# Patient Record
Sex: Male | Born: 1960
Health system: Southern US, Community
[De-identification: ages and names within clinical notes are randomized; demographics above are authoritative.]

## PROBLEM LIST (undated history)

## (undated) DIAGNOSIS — G43909 Migraine, unspecified, not intractable, without status migrainosus: Secondary | ICD-10-CM

## (undated) HISTORY — DX: Migraine, unspecified, not intractable, without status migrainosus: G43.909

---

## 2000-01-10 ENCOUNTER — Emergency Department (HOSPITAL_COMMUNITY): Admission: EM | Admit: 2000-01-10 | Discharge: 2000-01-10 | Payer: Self-pay | Admitting: Ophthalmology

## 2005-07-07 ENCOUNTER — Ambulatory Visit (HOSPITAL_COMMUNITY): Admission: RE | Admit: 2005-07-07 | Discharge: 2005-07-07 | Payer: Self-pay | Admitting: Allergy

## 2005-07-07 IMAGING — CR DG CHEST 2V
2 series · 2 of 2 positions shown · non-contrast
Comparison: None.

CLINICAL DATA: Cough and asthma. 
 CHEST - 2 VIEW ? [DATE]:

[view not recorded (1 of 2)]
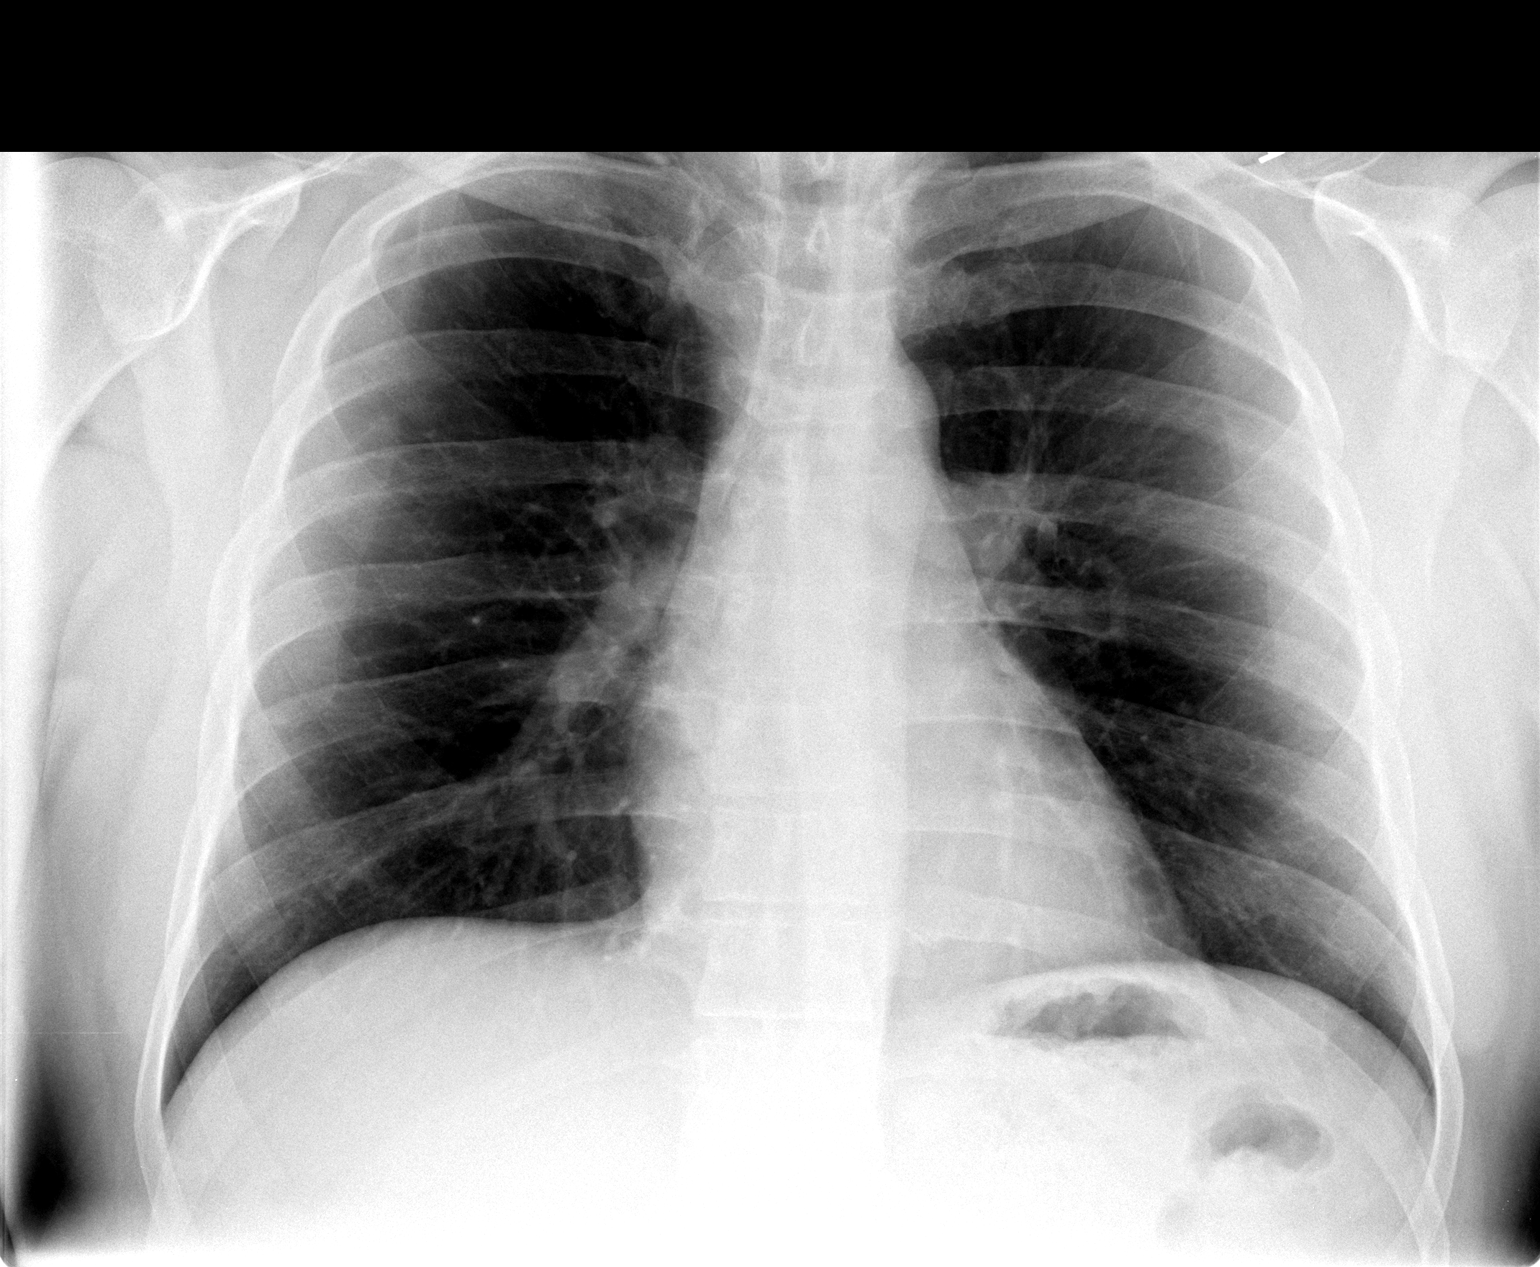

[view not recorded (2 of 2)]
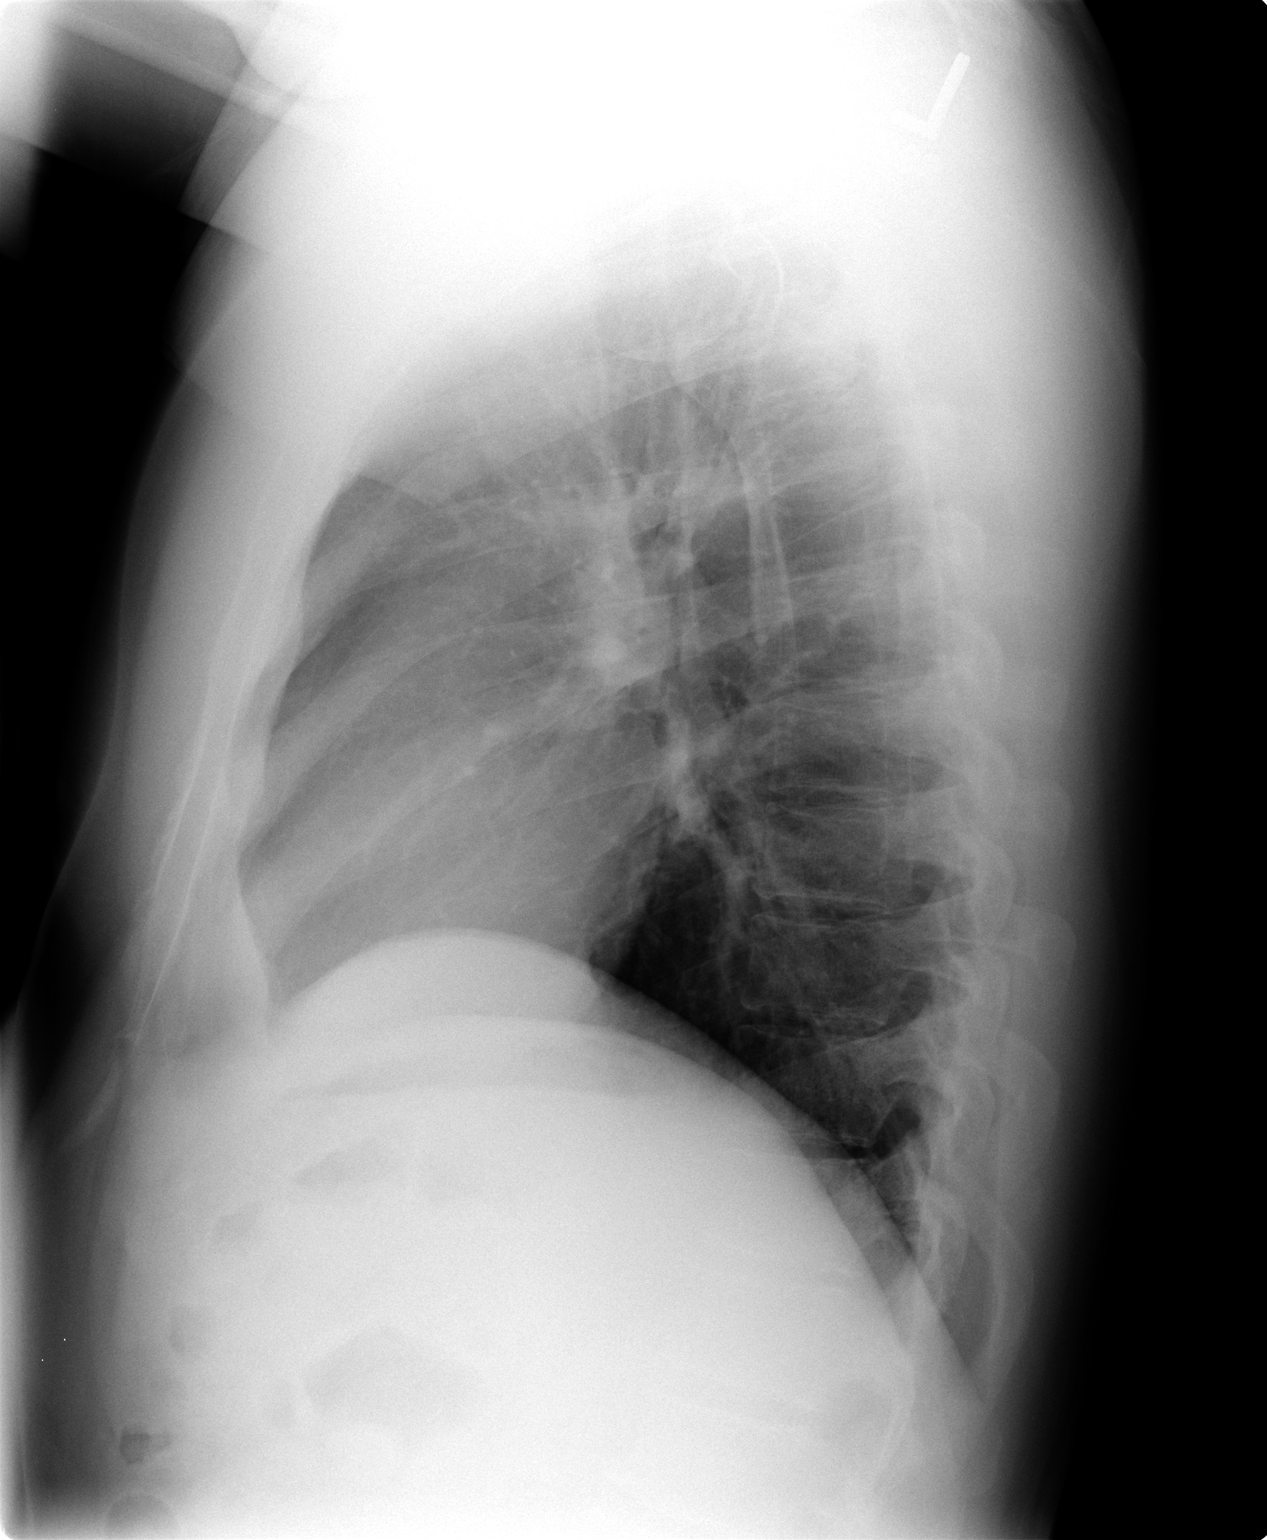

[2 of 2 positions shown; findings below may reference images not displayed]

FINDINGS: Heart size is normal.   There are no effusions or edema. No focal airspace opacities are noted.
IMPRESSION: No active cardiopulmonary disease.

## 2008-01-02 ENCOUNTER — Encounter: Admission: RE | Admit: 2008-01-02 | Discharge: 2008-01-02 | Payer: Self-pay | Admitting: Family Medicine

## 2008-01-02 IMAGING — CT CT ABDOMEN W/ CM
2 of 5 series · 17 of 46 positions shown, 19 images · IV contrast (READICAT/WATER & [ID] [id number])
Comparison: None

CT ABDOMEN

CLINICAL DATA: Right lower quadrant abdominal pain

CT ABDOMEN AND PELVIS WITH CONTRAST
TECHNIQUE: Multidetector CT imaging of the abdomen and pelvis was
performed using the standard protocol following bolus
administration of intravenous contrast.
Contrast: 125 ml [2E]

[Series 2: abdomen w/ · axial · 0.84mm/px · z∈[-400,+25]mm · 14 of 93 slices shown, 16 images]
[im 5/93  soft-tissue]
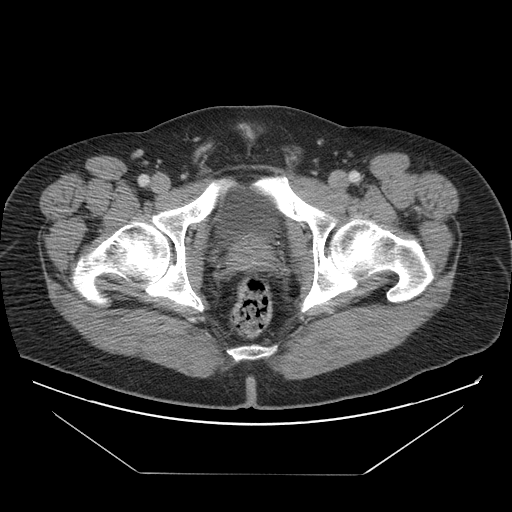
[im 5/93  bone]
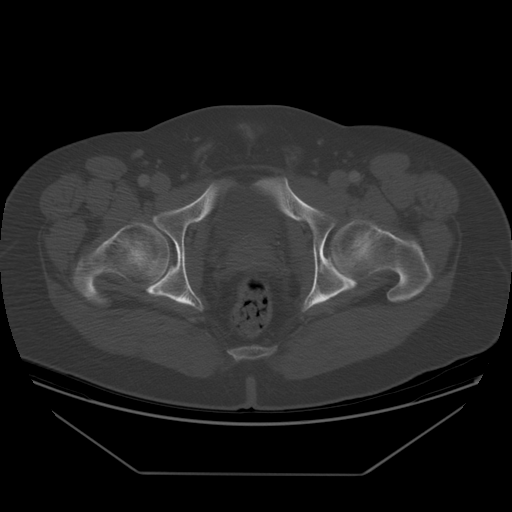
[im 14/93  soft-tissue]
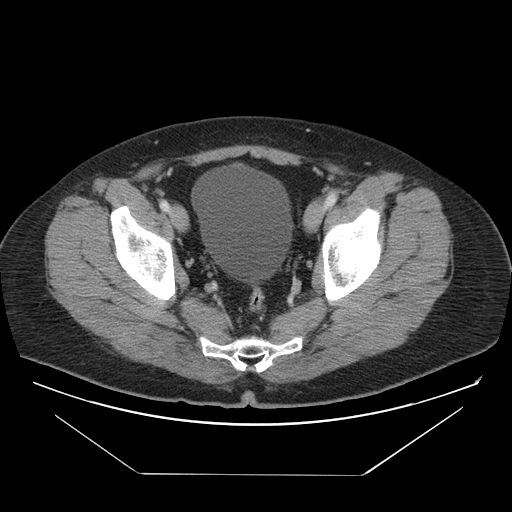
[im 19/93  soft-tissue]
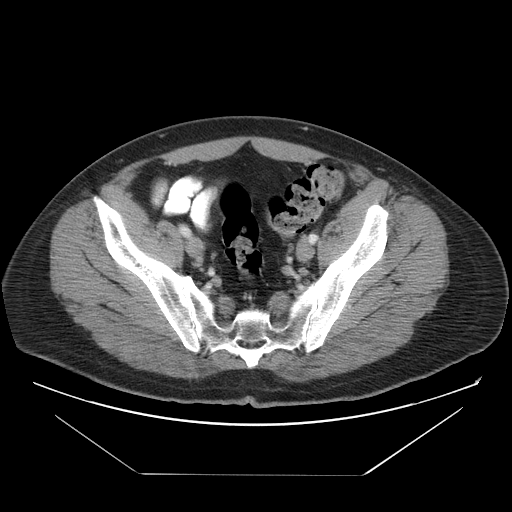
[im 24/93  soft-tissue]
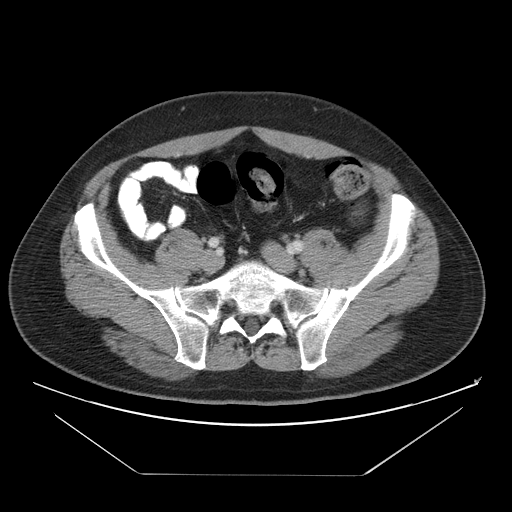
[im 33/93  soft-tissue]
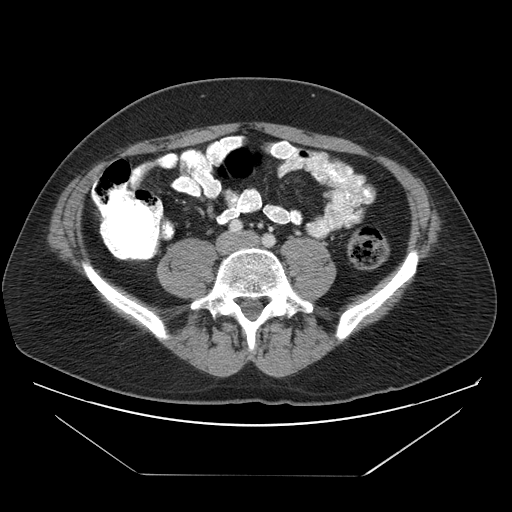
[im 37/93  soft-tissue]
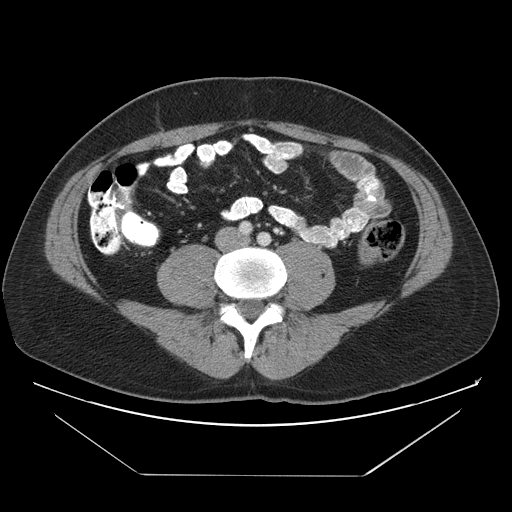
[im 42/93  soft-tissue]
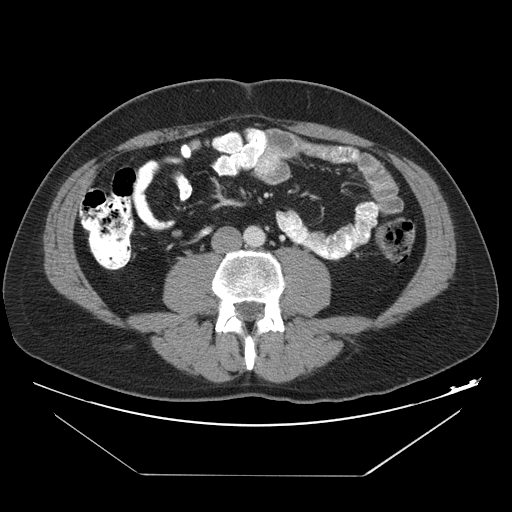
[im 51/93  soft-tissue]
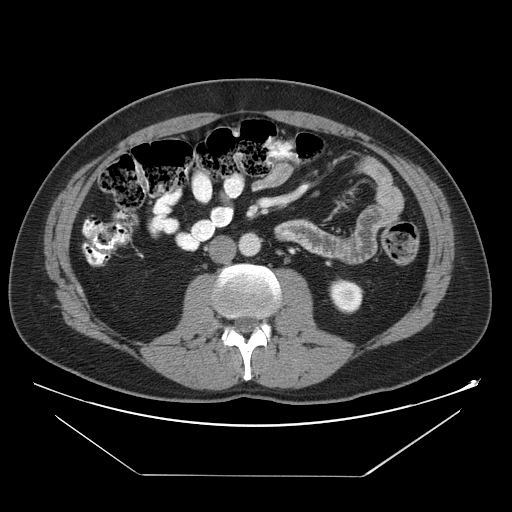
[im 56/93  soft-tissue]
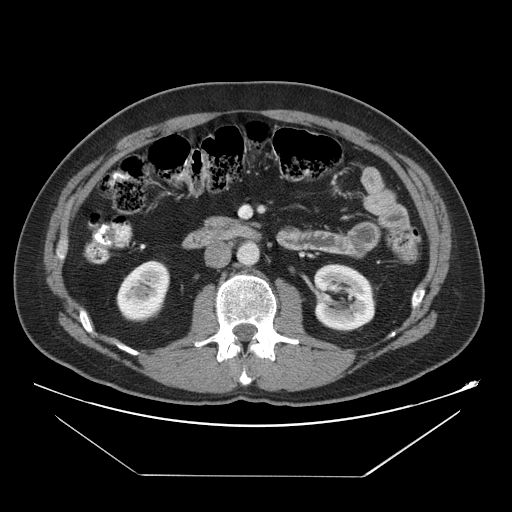
[im 56/93  bone]
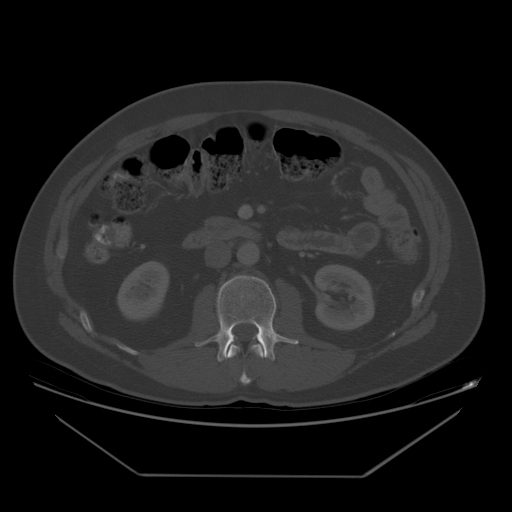
[im 60/93  soft-tissue]
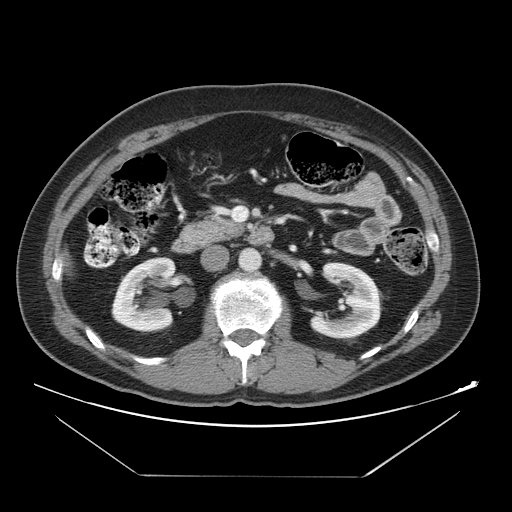
[im 70/93  soft-tissue]
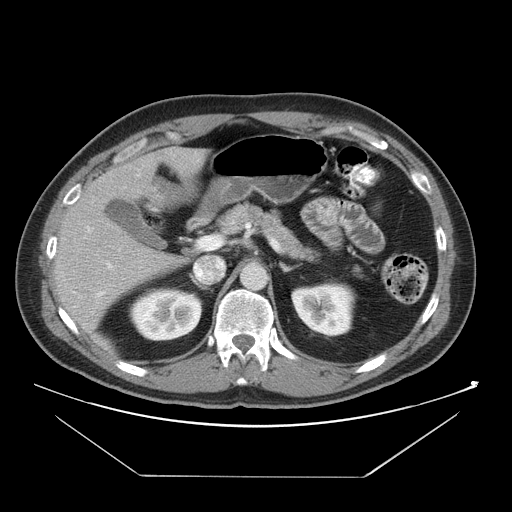
[im 74/93  soft-tissue]
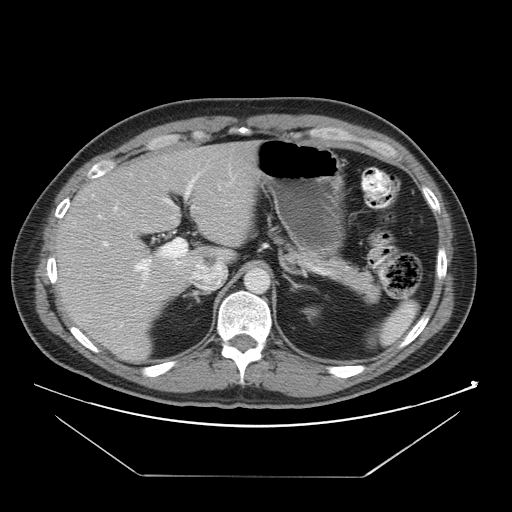
[im 79/93  soft-tissue]
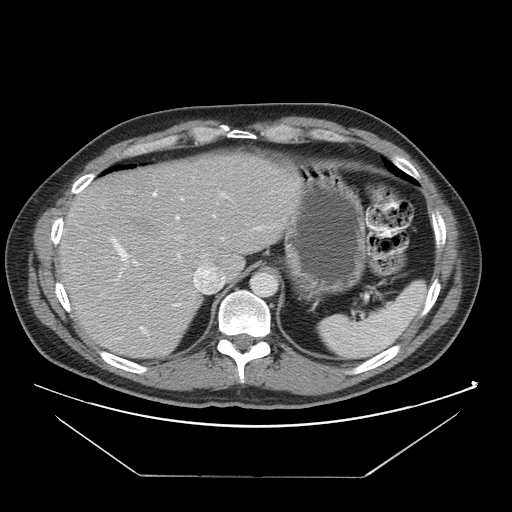
[im 88/93  soft-tissue]
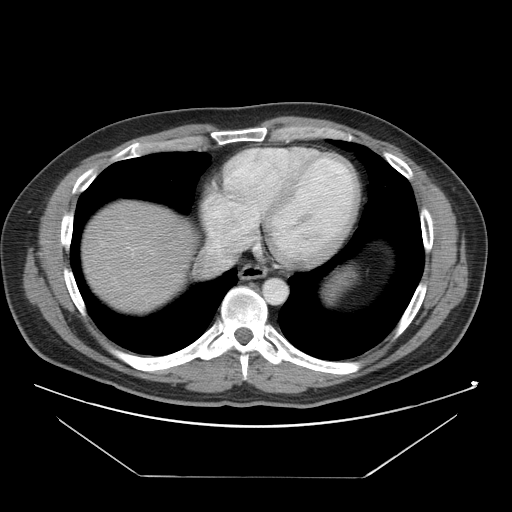

[Series 401: coronal · coronal · 1.04mm/px · 3 of 122 slices shown]
[im 41/122  soft-tissue]
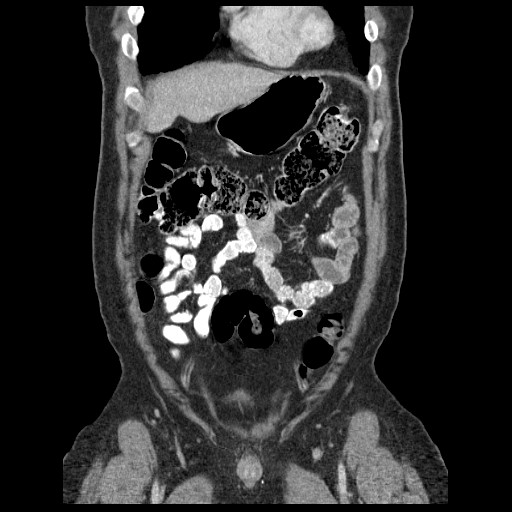
[im 54/122  soft-tissue]
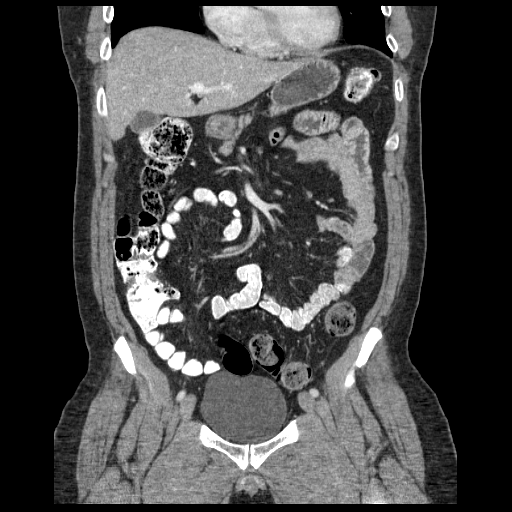
[im 68/122  soft-tissue]
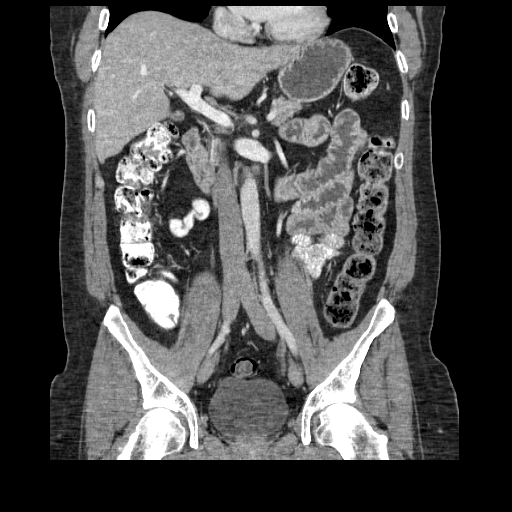

[17 of 46 positions shown; findings below may reference images not displayed]

FINDINGS: The lung bases are clear.  The liver is slightly low in
attenuation and mild fatty infiltration is a consideration.  No
calcified gallstones are seen.  The pancreas is normal in size and
the pancreatic duct is not dilated.  The adrenal glands and spleen
appear normal.  The kidneys enhance and on delayed images there are
small parapelvic cysts bilaterally, with the pelvocaliceal systems
appearing normal.  The abdominal aorta is normal in caliber.  No
adenopathy is seen.
IMPRESSION: No significant abnormality on CT of the abdomen.  Possible mild
fatty infiltration of the liver.

CT PELVIS
FINDINGS: The appendix and the terminal ileum are relatively well
seen and appear normal.  There is no evidence by CT of acute
appendicitis.  Moderate amount of feces is noted throughout the
colon.  The urinary bladder is unremarkable.  No pelvic mass or
fluid is seen.  No bony abnormality is noted.
IMPRESSION: No acute abnormality on CT the pelvis.  The appendix and terminal
ileum appear normal.

## 2008-05-03 DIAGNOSIS — J398 Other specified diseases of upper respiratory tract: Secondary | ICD-10-CM | POA: Insufficient documentation

## 2009-07-29 ENCOUNTER — Ambulatory Visit: Payer: Self-pay | Admitting: Family Medicine

## 2009-07-29 DIAGNOSIS — G43909 Migraine, unspecified, not intractable, without status migrainosus: Secondary | ICD-10-CM | POA: Insufficient documentation

## 2009-07-29 DIAGNOSIS — R1011 Right upper quadrant pain: Secondary | ICD-10-CM

## 2009-07-29 DIAGNOSIS — G4700 Insomnia, unspecified: Secondary | ICD-10-CM

## 2009-09-13 ENCOUNTER — Ambulatory Visit: Payer: Self-pay | Admitting: Family Medicine

## 2009-09-17 LAB — CONVERTED CEMR LAB
ALT: 40 units/L (ref 0–53)
AST: 38 units/L — ABNORMAL HIGH (ref 0–37)
Alkaline Phosphatase: 40 units/L (ref 39–117)
Bilirubin, Direct: 0.1 mg/dL (ref 0.0–0.3)
Cholesterol: 181 mg/dL (ref 0–200)
GFR calc non Af Amer: 72.36 mL/min (ref >60)
Glucose, Bld: 95 mg/dL (ref 70–99)
HDL: 62.4 mg/dL (ref >39.00)
Potassium: 4.6 meq/L (ref 3.5–5.1)
Total Bilirubin: 0.7 mg/dL (ref 0.3–1.2)
Total CHOL/HDL Ratio: 3
Triglycerides: 58 mg/dL (ref 0.0–149.0)

## 2010-02-06 DIAGNOSIS — J0100 Acute maxillary sinusitis, unspecified: Secondary | ICD-10-CM | POA: Insufficient documentation

## 2010-02-26 ENCOUNTER — Telehealth: Payer: Self-pay | Admitting: Family Medicine

## 2010-04-07 ENCOUNTER — Telehealth: Payer: Self-pay | Admitting: Family Medicine

## 2010-05-12 ENCOUNTER — Telehealth: Payer: Self-pay | Admitting: Family Medicine

## 2010-05-22 NOTE — Progress Notes (Signed)
Summary: alprazolam   Phone Note Refill Request Message from:  Fax from Pharmacy on April 07, 2010 9:23 AM  Refills Requested: Medication #1:  ALPRAZOLAM 0.25 MG TABS Take 1 or 2 tablets by mouth once daily as directed.   Last Refilled: 02/26/2010 [facility]. [phone].   Initial call taken by: Melody Comas,  April 07, 2010 9:23 AM  Follow-up for Phone Call        denied, filled 02/26/2010 Hannah Beat MD  April 07, 2010 9:48 AM   Additional Follow-up for Phone Call Additional follow up Details #1::        my error. please refill for my partner, filling for Dr. Dayton Martes, on vacation. Hannah Beat MD  April 07, 2010 10:27 AM  Rx called to [facility].  Additional Follow-up by: Linde Gillis CMA Duncan Dull),  April 07, 2010 11:03 AM    Prescriptions: ALPRAZOLAM 0.25 MG TABS (ALPRAZOLAM) Take 1 or 2 tablets by mouth once daily as directed.  #60 x 0   Entered and Authorized by:   Hannah Beat MD   Signed by:   Hannah Beat MD on 04/07/2010   Method used:   [facility]ph[facility] to ...       [facility]       [address]       [place], [place]  [address]       Ph: [phone]       Fax: [phone]   RxID:   [id number]

## 2010-05-22 NOTE — Assessment & Plan Note (Signed)
Summary: CPX/DLO   Vital Signs:  Patient profile:   50 year old male Height:      74 inches Weight:      244.25 pounds BMI:     31.47 Temp:     97.8 degrees F oral Pulse rate:   76 / minute Pulse rhythm:   regular BP sitting:   120 / 82  (right arm) Cuff size:   large  Vitals Entered By: Linde Gillis CMA Duncan Dull) (Sep 13, 2009 8:27 AM) CC: physical exam   History of Present Illness: 50 yo here for CPX.  Has been having several months of RUQ pain and nausea that is being worked up by GI. Colonoscopy was neg on 2/10/1. Has appt with [facility] on 08/07/09 for RUQ ultrasound, per pt ultrasound was normal. No blood in stool or changes in bowel habits. Not vomiting.  No yellowing of skin. No fevers, chills or weight loss. Since he stopped doing oblique crunches, has had no recurrance of the pain.    Migraines- gets 2-3 per month.  Associated with photophobia and nausea. Always alleviated by imitrex.  Change in seasons tends to be a trigger.  Tramadol and Immetrex both releive his migraines.    Takes propranolol for pubic speaking as needed, bp is normal.  Insomnia- was taking Ambien, made him feel hung over the next day.  Now taking Alprazolam 0.5 mg as needed at bedtime insomnia.  Well man- due for fasting labs. Very healthy male.  Exercises several days a week.  Current Medications (verified): 1)  Finasteride 5 Mg Tabs (Finasteride) .... Take 1/2 Tablet By Mouth Once Daily 2)  Propranolol Hcl 20 Mg Tabs (Propranolol Hcl) .... As Directed As Needed 3)  Alprazolam 0.25 Mg Tabs (Alprazolam) .... Take 1 or 2 Tablets By Mouth Once Daily As Directed. 4)  Imitrex 100 Mg Tabs (Sumatriptan Succinate) .... Take One Tablet By Mouth Once Daily 5)  Omeprazole 20 Mg Cpdr (Omeprazole) .... Take 1 Tablet By Mouth Once A Day 6)  Tramadol Hcl 50 Mg  Tabs (Tramadol Hcl) .Marland Kitchen.. 1 Tablet Twice Daily As Needed  Allergies (verified): No Known Drug Allergies  Past History:  Past Medical  History: Last updated: 11-Aug-2009 Migraines Normal colonoscopy in 05/2009  Family History: Last updated: 11-Aug-2009 Dad- deceased, MI in 48s  Social History: Last updated: Aug 11, 2009 Lives with wife and 2 daughters, 48 and 17. Never Smoked Alcohol use-no Drug use-no  Risk Factors: Smoking Status: never (Aug 11, 2009)  Family History: Reviewed history from 08-11-09 and no changes required. Dad- deceased, MI in 48s  Social History: Reviewed history from 08/11/09 and no changes required. Lives with wife and 2 daughters, 48 and 15. Never Smoked Alcohol use-no Drug use-no  Review of Systems      See HPI General:  Denies loss of appetite and malaise. Eyes:  Denies blurring. ENT:  Denies difficulty swallowing. CV:  Denies chest pain or discomfort. Resp:  Denies shortness of breath. GI:  Denies abdominal pain, bloody stools, and change in bowel habits. GU:  Denies erectile dysfunction, urinary frequency, and urinary hesitancy. MS:  Denies joint pain, joint redness, joint swelling, and loss of strength. Derm:  Denies rash. Neuro:  Denies headaches. Psych:  Denies anxiety and depression. Endo:  Denies cold intolerance and heat intolerance. Heme:  Denies abnormal bruising and bleeding.  Physical Exam  General:  Well-developed,well-nourished,in no acute distress; alert,appropriate and cooperative throughout examination Head:  normocephalic, atraumatic, and no abnormalities observed.   Eyes:  vision grossly  intact, pupils equal, pupils round, and pupils reactive to light.   Ears:  R ear normal and L ear normal.   Nose:  no external deformity.   Mouth:  good dentition.   Lungs:  Normal respiratory effort, chest expands symmetrically. Lungs are clear to auscultation, no crackles or wheezes. Heart:  Normal rate and regular rhythm. S1 and S2 normal without gallop, murmur, click, rub or other extra sounds. Abdomen:  Bowel sounds positive,abdomen soft and non-tender without  masses, organomegaly or hernias noted. Msk:  No deformity or scoliosis noted of thoracic or lumbar spine.   Extremities:  No clubbing, cyanosis, edema, or deformity noted with normal full range of motion of all joints.   Neurologic:  No cranial nerve deficits noted. Station and gait are normal. Plantar reflexes are down-going bilaterally. DTRs are symmetrical throughout. Sensory, motor and coordinative functions appear intact. Skin:  Intact without suspicious lesions or rashes Psych:  Cognition and judgment appear intact. Alert and cooperative with normal attention span and concentration. No apparent delusions, illusions, hallucinations   Impression & Recommendations:  Problem # 1:  PHYSICAL EXAMINATION (ICD-V70.0) Reviewed preventive care protocols, scheduled due services, and updated immunizations Discussed nutrition, exercise, diet, and healthy lifestyle.  FLP, BMET, hepatic panel, PSA today.  Orders: Venipuncture ([address]) TLB-BMP (Basic Metabolic Panel-BMET) (80048-METABOL)  Complete Medication List: 1)  Finasteride 5 Mg Tabs (Finasteride) .... Take 1/2 tablet by mouth once daily 2)  Propranolol Hcl 20 Mg Tabs (Propranolol hcl) .... As directed as needed 3)  Alprazolam 0.25 Mg Tabs (Alprazolam) .... Take 1 or 2 tablets by mouth once daily as directed. 4)  Imitrex 100 Mg Tabs (Sumatriptan succinate) .... Take one tablet by mouth once daily 5)  Omeprazole 20 Mg Cpdr (Omeprazole) .... Take 1 tablet by mouth once a day 6)  Tramadol Hcl 50 Mg Tabs (Tramadol hcl) .Marland Kitchen.. 1 tablet twice daily as needed  Other Orders: TLB-Hepatic/Liver Function Pnl (80076-HEPATIC) TLB-Lipid Panel (80061-LIPID) TLB-PSA (Prostate Specific Antigen) (84153-PSA) Prescriptions: TRAMADOL HCL 50 MG  TABS (TRAMADOL HCL) 1 tablet twice daily as needed  #60 x 6   Entered and Authorized by:   Ruthe Mannan MD   Signed by:   Ruthe Mannan MD on 09/13/2009   Method used:   Print then Give to Patient   RxID:    [id number] IMITREX 100 MG TABS (SUMATRIPTAN SUCCINATE) take one tablet by mouth once daily  #10 x 5   Entered and Authorized by:   Ruthe Mannan MD   Signed by:   Ruthe Mannan MD on 09/13/2009   Method used:   Print then Give to Patient   RxID:   [id number] ALPRAZOLAM 0.25 MG TABS (ALPRAZOLAM) Take 1 or 2 tablets by mouth once daily as directed.  #60 x 3   Entered and Authorized by:   Ruthe Mannan MD   Signed by:   Ruthe Mannan MD on 09/13/2009   Method used:   Print then Give to Patient   RxID:   [id number]   Current Allergies (reviewed today): No known allergies

## 2010-05-22 NOTE — Progress Notes (Signed)
Summary: alprazolam   Phone Note Refill Request Message from:  Fax from Pharmacy on May 12, 2010 9:39 AM  Refills Requested: Medication #1:  ALPRAZOLAM 0.25 MG TABS Take 1 or 2 tablets by mouth once daily as directed.   Last Refilled: 04/07/2010 [facility]. [phone]  Initial call taken by: Melody Comas,  May 12, 2010 9:39 AM  Follow-up for Phone Call        [facility]. Follow-up by: Linde Gillis CMA Duncan Dull),  May 12, 2010 9:48 AM    Prescriptions: ALPRAZOLAM 0.25 MG TABS (ALPRAZOLAM) Take 1 or 2 tablets by mouth once daily as directed.  #60 x 0   Entered and Authorized by:   Ruthe Mannan MD   Signed by:   Ruthe Mannan MD on 05/12/2010   Method used:   [facility]ph[facility] to ...       [facility]       [address]       [place], [place]  [address]       Ph: [phone]       Fax: [phone]   RxID:   [id number]

## 2010-05-22 NOTE — Progress Notes (Signed)
Summary: alprazolam   Phone Note Refill Request Message from:  Fax from Pharmacy on February 26, 2010 10:30 AM  Refills Requested: Medication #1:  ALPRAZOLAM 0.25 MG TABS Take 1 or 2 tablets by mouth once daily as directed.   Last Refilled: 01/27/2010 [facility]. [phone].   Initial call taken by: Melody Comas,  February 26, 2010 10:31 AM  Follow-up for Phone Call        Medication [facility]. Follow-up by: Delilah Shan CMA Duncan Dull),  February 26, 2010 11:27 AM    Prescriptions: ALPRAZOLAM 0.25 MG TABS (ALPRAZOLAM) Take 1 or 2 tablets by mouth once daily as directed.  #60 x 0   Entered and Authorized by:   Ruthe Mannan MD   Signed by:   Ruthe Mannan MD on 02/26/2010   Method used:   [facility]ph[facility] to ...       [facility]       [address]       [place], [place]  [address]       Ph: [phone]       Fax: [phone]   RxID:   [id number]

## 2010-05-22 NOTE — Assessment & Plan Note (Signed)
Summary: new patient/rbh   Vital Signs:  Patient profile:   50 year old male Height:      74 inches Weight:      247.13 pounds BMI:     31.84 Temp:     98.8 degrees F oral Pulse rate:   80 / minute Pulse rhythm:   regular BP sitting:   108 / 60  (left arm) Cuff size:   large  Vitals Entered By: Delilah Shan CMA Duncan Dull) (July 29, 2009 2:12 PM) CC: New Patient to Establish   History of Present Illness: 50 yo here to establish care.  Has been having several months of RUQ pain and nausea that is being worked up by GI. Colonoscopy was neg on 2/10/1. Has appt with [facility] on 08/07/09 for RUQ ultrasound. No blood in stool or changes in bowel habits. Not vomiting.  No yellowing of skin. No fevers, chills or weight loss.  Migraines- gets 2-3 per month.  Associated with photophobia and nausea. Always alleviated by imitrex.  Change in seasons tends to be a trigger.  Takes propranolol for pubic speaking as needed, bp is normal.  Insomnia- was taking Ambien, made him feel hung over the next day.  Now taking Alprazolam 0.5 mg as needed at bedtime insomnia.  Well man- due for a physical exam in May. Very healthy male.  Exercises several days a week.  Preventive Screening-Counseling & Management  Alcohol-Tobacco     Smoking Status: never      Drug Use:  no.    Current Medications (verified): 1)  Finasteride 5 Mg Tabs (Finasteride) .... Take 1/2 Tablet By Mouth Once Daily 2)  Propranolol Hcl 20 Mg Tabs (Propranolol Hcl) .... As Directed As Needed 3)  Alprazolam 0.25 Mg Tabs (Alprazolam) .... Take 1 or 2 Tablets By Mouth Once Daily As Directed. 4)  Imitrex 25 Mg Tabs (Sumatriptan Succinate) .... ? Dosage.   As Needed 5)  Omeprazole 20 Mg Cpdr (Omeprazole) .... Take 1 Tablet By Mouth Once A Day 6)  Promethazine Hcl 25 Mg Tabs (Promethazine Hcl) .... ? Dosage   As Needed Nausea.  Allergies (verified): No Known Drug Allergies  Past History:  Past Medical  History: Migraines Normal colonoscopy in 05/2009  Family History: Dad- deceased, MI in 26s  Social History: Lives with wife and 2 daughters, 41 and 25. Never Smoked Alcohol use-no Drug use-no Smoking Status:  never Drug Use:  no  Review of Systems      See HPI General:  Denies chills, fever, malaise, weakness, and weight loss. Eyes:  Denies blurring. ENT:  Denies difficulty swallowing. CV:  Denies chest pain or discomfort. Resp:  Denies shortness of breath. GI:  Complains of abdominal pain and nausea; denies bloody stools, vomiting, and vomiting blood. GU:  Denies incontinence, nocturia, urinary frequency, and urinary hesitancy. MS:  Denies muscle weakness and stiffness. Derm:  Denies rash. Neuro:  Denies tingling, tremors, visual disturbances, and weakness. Psych:  Denies anxiety and depression. Heme:  Denies abnormal bruising, bleeding, enlarge lymph nodes, fevers, pallor, and skin discoloration.  Physical Exam  General:  Well-developed,well-nourished,in no acute distress; alert,appropriate and cooperative throughout examination Ears:  R ear normal and L ear normal.   Mouth:  MMM Lungs:  Normal respiratory effort, chest expands symmetrically. Lungs are clear to auscultation, no crackles or wheezes. Heart:  Normal rate and regular rhythm. S1 and S2 normal without gallop, murmur, click, rub or other extra sounds. Abdomen:  Bowel sounds positive,abdomen soft and non-tender without  masses, organomegaly or hernias noted. Extremities:  no edema Neurologic:  alert & oriented X3 and gait normal.   Skin:  Intact without suspicious lesions or rashes Psych:  Oriented X3, memory intact for recent and remote, normally interactive, and good eye contact.     Impression & Recommendations:  Problem # 1:  RUQ PAIN (ICD-789.01) Assessment New Appears very consistent with biliary colic.  Has appt next week with GI.  Will await those records.    Problem # 2:  INSOMNIA  (ICD-780.52) Assessment: New Appear controlled with Alprazolam.  Consider sleep study if symptoms worsen.  Problem # 3:  MIGRAINE HEADACHE (ICD-346.90) Assessment: Unchanged Stable with abortive therapy only.  If develops increase in number per month, may need prophylactic therapy (could take his betablocker daily instead of as needed). His updated medication list for this problem includes:    Propranolol Hcl 20 Mg Tabs (Propranolol hcl) .Marland Kitchen... As directed as needed    Imitrex 25 Mg Tabs (Sumatriptan succinate) ..... ? dosage.   as needed  Problem # 4:  Preventive Health Care (ICD-V70.0) Assessment: Comment Only Make appt next month for CPX.  Complete Medication List: 1)  Finasteride 5 Mg Tabs (Finasteride) .... Take 1/2 tablet by mouth once daily 2)  Propranolol Hcl 20 Mg Tabs (Propranolol hcl) .... As directed as needed 3)  Alprazolam 0.25 Mg Tabs (Alprazolam) .... Take 1 or 2 tablets by mouth once daily as directed. 4)  Imitrex 25 Mg Tabs (Sumatriptan succinate) .... ? dosage.   as needed 5)  Omeprazole 20 Mg Cpdr (Omeprazole) .... Take 1 tablet by mouth once a day 6)  Promethazine Hcl 25 Mg Tabs (Promethazine hcl) .... ? dosage   as needed nausea.  Patient Instructions: 1)  Great to meet you, Mr. Prows. 2)  Please make an appointment on your way out for a complete physical, ok to make appointment first thing in morning. 3)  Have a great week.  Current Allergies (reviewed today): No known allergies   TD Result Date:  08/08/2003 TD Result:  historical TD Next Due:  10 yr

## 2010-05-22 NOTE — Letter (Signed)
Summary: Records Dated 07-06-03 thru 07-24-09/[facility]  Records Dated 07-06-03 thru 07-24-09/[facility]   Imported By: Lanelle Bal 08/08/2009 09:03:32  _____________________________________________________________________  External Attachment:    Type:   Image     Comment:   External Document

## 2010-06-17 ENCOUNTER — Telehealth: Payer: Self-pay | Admitting: Family Medicine

## 2010-06-26 NOTE — Progress Notes (Signed)
Summary: alprazolam  Phone Note Refill Request Message from:  Fax from Pharmacy on June 17, 2010 11:02 AM  Refills Requested: Medication #1:  ALPRAZOLAM 0.25 MG TABS Take 1 or 2 tablets by mouth once daily as directed.   Last Refilled: 05/12/2010 [facility]. [phone].   Initial call taken by: Melody Comas,  June 17, 2010 11:02 AM  Follow-up for Phone Call        [facility]. Follow-up by: Linde Gillis CMA Duncan Dull),  June 17, 2010 4:56 PM    Prescriptions: ALPRAZOLAM 0.25 MG TABS (ALPRAZOLAM) Take 1 or 2 tablets by mouth once daily as directed.  #60 x 0   Entered and Authorized by:   Ruthe Mannan MD   Signed by:   Ruthe Mannan MD on 06/17/2010   Method used:   Telephoned to ...       [facility]       [address]       [place], [place]  [address]       Ph: [phone]       Fax: [phone]   RxID:   [id number]

## 2010-07-17 ENCOUNTER — Other Ambulatory Visit: Payer: Self-pay | Admitting: *Deleted

## 2010-07-17 MED ORDER — ALPRAZOLAM 0.25 MG PO TABS: ORAL_TABLET | ORAL | Status: DC

## 2010-07-17 NOTE — Telephone Encounter (Signed)
Rx called to [facility]. 

## 2010-07-17 NOTE — Telephone Encounter (Signed)
Rx phoned to pharmacy.  

## 2010-07-18 ENCOUNTER — Other Ambulatory Visit: Payer: Self-pay | Admitting: *Deleted

## 2010-07-18 MED ORDER — PROPRANOLOL HCL 20 MG PO TABS: ORAL_TABLET | ORAL | Status: DC

## 2010-08-18 ENCOUNTER — Other Ambulatory Visit: Payer: Self-pay | Admitting: *Deleted

## 2010-08-18 MED ORDER — ALPRAZOLAM 0.25 MG PO TABS: ORAL_TABLET | ORAL | Status: DC

## 2010-09-17 ENCOUNTER — Other Ambulatory Visit: Payer: Self-pay | Admitting: *Deleted

## 2010-09-17 MED ORDER — ALPRAZOLAM 0.25 MG PO TABS: ORAL_TABLET | ORAL | Status: DC

## 2010-09-17 NOTE — Telephone Encounter (Signed)
Refill request for Alprazolam 0.25 mg by mouth once daily qty 30  Last refilled 08/18/2010

## 2010-09-17 NOTE — Telephone Encounter (Signed)
[facility]

## 2010-10-16 ENCOUNTER — Other Ambulatory Visit: Payer: Self-pay | Admitting: *Deleted

## 2010-10-16 MED ORDER — ALPRAZOLAM 0.25 MG PO TABS: ORAL_TABLET | ORAL | Status: DC

## 2010-10-16 NOTE — Telephone Encounter (Signed)
Called to pharmacy 

## 2010-11-11 ENCOUNTER — Other Ambulatory Visit: Payer: Self-pay | Admitting: *Deleted

## 2010-11-11 MED ORDER — ALPRAZOLAM 0.25 MG PO TABS: ORAL_TABLET | ORAL | Status: DC

## 2010-11-11 NOTE — Telephone Encounter (Signed)
Rx called to pharmacy

## 2010-12-01 ENCOUNTER — Other Ambulatory Visit: Payer: Self-pay | Admitting: *Deleted

## 2010-12-01 MED ORDER — ALPRAZOLAM 0.25 MG PO TABS: ORAL_TABLET | ORAL | Status: DC

## 2010-12-02 ENCOUNTER — Other Ambulatory Visit: Payer: Self-pay | Admitting: *Deleted

## 2010-12-05 NOTE — Telephone Encounter (Signed)
Rx called to pharmacy

## 2011-01-27 ENCOUNTER — Other Ambulatory Visit: Payer: Self-pay | Admitting: *Deleted

## 2011-01-27 MED ORDER — ALPRAZOLAM 0.25 MG PO TABS: ORAL_TABLET | ORAL | Status: DC

## 2011-01-27 NOTE — Telephone Encounter (Signed)
Last refill 12/27/2010

## 2011-01-27 NOTE — Telephone Encounter (Signed)
[facility] 

## 2011-01-28 ENCOUNTER — Ambulatory Visit (INDEPENDENT_AMBULATORY_CARE_PROVIDER_SITE_OTHER): Payer: BC Managed Care – PPO | Admitting: Family Medicine

## 2011-01-28 ENCOUNTER — Ambulatory Visit: Payer: Self-pay | Admitting: Family Medicine

## 2011-01-28 ENCOUNTER — Encounter: Payer: Self-pay | Admitting: Family Medicine

## 2011-01-28 VITALS — BP 120/70 | HR 50 | Temp 98.9°F | Ht 74.0 in | Wt 233.4 lb

## 2011-01-28 DIAGNOSIS — M79641 Pain in right hand: Secondary | ICD-10-CM

## 2011-01-28 DIAGNOSIS — M79609 Pain in unspecified limb: Secondary | ICD-10-CM

## 2011-01-28 DIAGNOSIS — L600 Ingrowing nail: Secondary | ICD-10-CM

## 2011-01-28 MED ORDER — HYDROCODONE-ACETAMINOPHEN 5-500 MG PO TABS: 1.0000 | ORAL_TABLET | Freq: Four times a day (QID) | ORAL | Status: AC | PRN

## 2011-01-28 NOTE — Progress Notes (Signed)
  Subjective:    Patient ID: Darren Wilson, male    DOB: [birth year], 50 y.o.   MRN: [id number]  HPI  Pleasant gentleman presents with a history of right-sided finger pain and hand pain on the medial aspect of the second digit at the nailbed for 2-3 weeks. No trauma or injury. He did cut his finger nail, and did have a hangnail, but this is progressively worsened. He did stick this with a pen last night and got some blood and some liquid out of it.  The PMH, PSH, Social History, Family History, Medications, and allergies have been reviewed in [facility], and have been updated if relevant.   Review of Systems ROS: GEN: No acute illnesses, no fevers, chills. GI: No n/v/d, eating normally Pulm: No SOB Interactive and getting along well at home.  Otherwise, ROS is as per the HPI.     Objective:   Physical Exam   Physical Exam  Blood pressure 120/70, pulse 50, temperature 98.9 F (37.2 C), temperature source Oral, height 6\' 2"  (1.88 m), weight 233 lb 6.4 oz (105.87 kg), SpO2 98.00%.  GEN: WDWN, NAD, Non-toxic, A & O x 3 HEENT: Atraumatic, Normocephalic. Neck supple. No masses, No LAD. Ears and Nose: No external deformity. EXTR: No c/c/e NEURO Normal gait.  PSYCH: Normally interactive. Conversant. Not depressed or anxious appearing.  Calm demeanor.  Hand: Nontender throughout hand and carpal bones. Right-sided medial edge of the fingernail is mildly swollen and mildly tender to palpation. There appears to be no frank pus and no expressible pus.      Assessment & Plan:   1. Hand pain, right   2. Ingrown fingernail     Motrin, tylenol, and prn vicodin for pain  Right, 2nd fingernail medial wedge resection Verbal and written consent obtained. Prepped with alcohol and 8 cc of Lidocaine 1% in ring block for anesthesia. Prepped with Chloraprep x 2. Nail elevator used to lift nail to base, then clipped to base and removed with hemostat. Silver nitrate applied to base and good hemostasis.  Discussed dressing and aftercare.

## 2011-01-28 NOTE — Patient Instructions (Signed)
  Keep clean Use topical ABX such as neosporin over the next few days  Tylenol, motrin, or pain med is ok if you need it.

## 2011-02-24 ENCOUNTER — Other Ambulatory Visit: Payer: Self-pay | Admitting: Family Medicine

## 2011-02-24 DIAGNOSIS — Z136 Encounter for screening for cardiovascular disorders: Secondary | ICD-10-CM

## 2011-02-24 DIAGNOSIS — Z0000 Encounter for general adult medical examination without abnormal findings: Secondary | ICD-10-CM

## 2011-02-26 ENCOUNTER — Other Ambulatory Visit: Payer: Self-pay | Admitting: *Deleted

## 2011-02-26 MED ORDER — ALPRAZOLAM 0.25 MG PO TABS: ORAL_TABLET | ORAL | Status: DC

## 2011-02-26 NOTE — Telephone Encounter (Signed)
Last refill 01/27/2011. 

## 2011-02-26 NOTE — Telephone Encounter (Signed)
[facility]

## 2011-02-27 ENCOUNTER — Other Ambulatory Visit (INDEPENDENT_AMBULATORY_CARE_PROVIDER_SITE_OTHER): Payer: BC Managed Care – PPO

## 2011-02-27 DIAGNOSIS — Z0000 Encounter for general adult medical examination without abnormal findings: Secondary | ICD-10-CM

## 2011-02-27 DIAGNOSIS — Z136 Encounter for screening for cardiovascular disorders: Secondary | ICD-10-CM

## 2011-02-27 LAB — LIPID PANEL
Cholesterol: 191 mg/dL (ref 0–200)
HDL: 77.1 mg/dL (ref >39.00)
LDL Cholesterol: 107 mg/dL — ABNORMAL HIGH (ref 0–99)
Total CHOL/HDL Ratio: 2
Triglycerides: 37 mg/dL (ref 0.0–149.0)
VLDL: 7.4 mg/dL (ref 0.0–40.0)

## 2011-02-27 LAB — BASIC METABOLIC PANEL
BUN: 21 mg/dL (ref 6–23)
Calcium: 9.4 mg/dL (ref 8.4–10.5)
Glucose, Bld: 106 mg/dL — ABNORMAL HIGH (ref 70–99)
Sodium: 139 mEq/L (ref 135–145)

## 2011-03-05 ENCOUNTER — Encounter: Payer: Self-pay | Admitting: Family Medicine

## 2011-03-05 ENCOUNTER — Ambulatory Visit (INDEPENDENT_AMBULATORY_CARE_PROVIDER_SITE_OTHER): Payer: BC Managed Care – PPO | Admitting: Family Medicine

## 2011-03-05 VITALS — BP 110/82 | HR 76 | Temp 98.7°F | Ht 74.0 in | Wt 231.2 lb

## 2011-03-05 DIAGNOSIS — K589 Irritable bowel syndrome without diarrhea: Secondary | ICD-10-CM | POA: Insufficient documentation

## 2011-03-05 DIAGNOSIS — G43909 Migraine, unspecified, not intractable, without status migrainosus: Secondary | ICD-10-CM

## 2011-03-05 DIAGNOSIS — G4700 Insomnia, unspecified: Secondary | ICD-10-CM

## 2011-03-05 DIAGNOSIS — Z0000 Encounter for general adult medical examination without abnormal findings: Secondary | ICD-10-CM

## 2011-03-05 MED ORDER — DICYCLOMINE HCL 10 MG PO CAPS: 10.0000 mg | ORAL_CAPSULE | Freq: Four times a day (QID) | ORAL | Status: AC

## 2011-03-05 MED ORDER — PROPRANOLOL HCL 20 MG PO TABS: ORAL_TABLET | ORAL | Status: DC

## 2011-03-05 MED ORDER — SUMATRIPTAN SUCCINATE 100 MG PO TABS: 100.0000 mg | ORAL_TABLET | ORAL | Status: DC | PRN

## 2011-03-05 NOTE — Patient Instructions (Signed)
Health Maintenance, Males A healthy lifestyle and preventative care can promote health and wellness.  Maintain regular health, dental, and eye exams.   Eat a healthy diet. Foods like vegetables, fruits, whole grains, low-fat dairy products, and lean protein foods contain the nutrients you need without too many calories. Decrease your intake of foods high in solid fats, added sugars, and salt. Get information about a proper diet from your caregiver, if necessary.   Regular physical exercise is one of the most important things you can do for your health. Most adults should get at least 150 minutes of moderate-intensity exercise (any activity that increases your heart rate and causes you to sweat) each week. In addition, most adults need muscle-strengthening exercises on 2 or more days a week.    Maintain a healthy weight. The body mass index (BMI) is a screening tool to identify possible weight problems. It provides an estimate of body fat based on height and weight. Your caregiver can help determine your BMI, and can help you achieve or maintain a healthy weight. For adults 20 years and older:   A BMI below 18.5 is considered underweight.   A BMI of 18.5 to 24.9 is normal.   A BMI of 25 to 29.9 is considered overweight.   A BMI of 30 and above is considered obese.   Maintain normal blood lipids and cholesterol by exercising and minimizing your intake of saturated fat. Eat a balanced diet with plenty of fruits and vegetables. Blood tests for lipids and cholesterol should begin at age 20 and be repeated every 5 years. If your lipid or cholesterol levels are high, you are over 50, or you are a high risk for heart disease, you may need your cholesterol levels checked more frequently.Ongoing high lipid and cholesterol levels should be treated with medicines, if diet and exercise are not effective.   If you smoke, find out from your caregiver how to quit. If you do not use tobacco, do not start.    If you choose to drink alcohol, do not exceed 2 drinks per day. One drink is considered to be 12 ounces (355 mL) of beer, 5 ounces (148 mL) of wine, or 1.5 ounces (44 mL) of liquor.   Avoid use of street drugs. Do not share needles with anyone. Ask for help if you need support or instructions about stopping the use of drugs.   High blood pressure causes heart disease and increases the risk of stroke. Blood pressure should be checked at least every 1 to 2 years. Ongoing high blood pressure should be treated with medicines if weight loss and exercise are not effective.   If you are 45 to 48 years old, ask your caregiver if you should take aspirin to prevent heart disease.   Diabetes screening involves taking a blood sample to check your fasting blood sugar level. This should be done once every 3 years, after age 45, if you are within normal weight and without risk factors for diabetes. Testing should be considered at a younger age or be carried out more frequently if you are overweight and have at least 1 risk factor for diabetes.   Colorectal cancer can be detected and often prevented. Most routine colorectal cancer screening begins at the age of 50 and continues through age 75. However, your caregiver may recommend screening at an earlier age if you have risk factors for colon cancer. On a yearly basis, your caregiver may provide home test kits to check for hidden   blood in the stool. Use of a small camera at the end of a tube, to directly examine the colon (sigmoidoscopy or colonoscopy), can detect the earliest forms of colorectal cancer. Talk to your caregiver about this at age 50, when routine screening begins. Direct examination of the colon should be repeated every 5 to 10 years through age 75, unless early forms of pre-cancerous polyps or small growths are found.   Healthy men should no longer receive prostate-specific antigen (PSA) blood tests as part of routine cancer screening. Consult with  your caregiver about prostate cancer screening.   Practice safe sex. Use condoms and avoid high-risk sexual practices to reduce the spread of sexually transmitted infections (STIs).   Use sunscreen with a sun protection factor (SPF) of 30 or greater. Apply sunscreen liberally and repeatedly throughout the day. You should seek shade when your shadow is shorter than you. Protect yourself by wearing long sleeves, pants, a wide-brimmed hat, and sunglasses year round, whenever you are outdoors.   Notify your caregiver of new moles or changes in moles, especially if there is a change in shape or color. Also notify your caregiver if a mole is larger than the size of a pencil eraser.   A one-time screening for abdominal aortic aneurysm (AAA) and surgical repair of large AAAs by sound wave imaging (ultrasonography) is recommended for ages 65 to 75 years who are current or former smokers.   Stay current with your immunizations.  Document Released: 10/03/2007 Document Revised: 12/17/2010 Document Reviewed: 09/01/2010 [employer] Patient Information 2012 [employer]. 

## 2011-03-05 NOTE — Progress Notes (Signed)
50 yo here for CPX.    Doing well. Ingrown nail healed well.   Gas/constipation- with ribbon stools. Had normal colonoscopy 05/2009 at [facility]. Has tried to eliminate dairy and caffeine, has not helped much. Some cramping, no abdominal pain. No nausea or fever. No blood or mucous in stool.  Migraines- gets 2-3 per month. Associated with photophobia and nausea.  Always alleviated by imitrex. Change in seasons tends to be a trigger. Tramadol and Immetrex both releive his migraines.   Takes propranolol for pubic speaking as needed, bp is normal.   Insomnia- was taking Ambien, made him feel hung over the next day. Now taking Alprazolam 0.5 mg as needed at bedtime insomnia.   Well man- very healthy.  Exercises regularly.  Lab Results  Component Value Date   CHOL 191 02/27/2011   HDL 77.10 02/27/2011   LDLCALC 107* 02/27/2011   TRIG 37.0 02/27/2011   CHOLHDL 2 02/27/2011     Review of Systems  See HPI  Patient reports no  vision/ hearing changes,anorexia, weight change, fever ,adenopathy, persistant / recurrent hoarseness, swallowing issues, chest pain, edema,persistant / recurrent cough, hemoptysis, dyspnea(rest, exertional, paroxysmal nocturnal), gastrointestinal  bleeding (melena, rectal bleeding), abdominal pain, excessive heart burn, GU symptoms(dysuria, hematuria, pyuria, voiding/incontinence  Issues) syncope, focal weakness, severe memory loss, concerning skin lesions, depression, anxiety, abnormal bruising/bleeding, major joint swelling.     Physical Exam  BP 110/82  Pulse 76  Temp(Src) 98.7 F (37.1 C) (Oral)  Ht 6\' 2"  (1.88 m)  Wt 231 lb 4 oz (104.894 kg)  BMI 29.69 kg/m2  General: Well-developed,well-nourished,in no acute distress; alert,appropriate and cooperative throughout examination  Head: normocephalic, atraumatic, and no abnormalities observed.  Eyes: vision grossly intact, pupils equal, pupils round, and pupils reactive to light.  Ears: R ear normal and L ear  normal.  Nose: no external deformity.  Mouth: good dentition.  Lungs: Normal respiratory effort, chest expands symmetrically. Lungs are clear to auscultation, no crackles or wheezes.  Heart: Normal rate and regular rhythm. S1 and S2 normal without gallop, murmur, click, rub or other extra sounds.  Abdomen: Bowel sounds positive,abdomen soft and non-tender without masses, organomegaly or hernias noted.  Msk: No deformity or scoliosis noted of thoracic or lumbar spine.  Extremities: No clubbing, cyanosis, edema, or deformity noted with normal full range of motion of all joints.  Neurologic: No cranial nerve deficits noted. Station and gait are normal. Plantar reflexes are down-going bilaterally. DTRs are symmetrical throughout. Sensory, motor and coordinative functions appear intact.  Skin: Intact without suspicious lesions or rashes  Psych: Cognition and judgment appear intact. Alert and cooperative with normal attention span and concentration.  Assessment and Plan: 1. Routine general medical examination at a health care facility  Reviewed preventive care protocols, scheduled due services, and updated immunizations Discussed nutrition, exercise, diet, and healthy lifestyle.   2. MIGRAINE HEADACHE  Rare, ok with just abortive therapy, triptan.  3. INSOMNIA  Stable.  4. IBS (irritable bowel syndrome)  Will try Bentyl as needed.  Pt to keep in touch with symptoms, keep a symptoms journal.

## 2011-03-30 ENCOUNTER — Other Ambulatory Visit: Payer: Self-pay | Admitting: *Deleted

## 2011-03-30 MED ORDER — ALPRAZOLAM 0.25 MG PO TABS: ORAL_TABLET | ORAL | Status: DC

## 2011-03-30 NOTE — Telephone Encounter (Signed)
Last refill 02/26/2011.

## 2011-03-31 NOTE — Telephone Encounter (Signed)
[facility]

## 2011-04-27 ENCOUNTER — Telehealth: Payer: Self-pay | Admitting: *Deleted

## 2011-04-27 NOTE — Telephone Encounter (Signed)
Patient advised as instructed via telephone.  He will call back later with an update.

## 2011-04-27 NOTE — Telephone Encounter (Signed)
He needs to be evaluated immediately.  Is he near an urgent care or ER?

## 2011-04-27 NOTE — Telephone Encounter (Signed)
Pt work up with R index finger numbness, also c/o strange finger down his arm, underside of finger looks slightly bluish. Pt has hx of R hand pain, but this is new.

## 2011-05-01 ENCOUNTER — Encounter: Payer: Self-pay | Admitting: Family Medicine

## 2011-05-01 ENCOUNTER — Ambulatory Visit (INDEPENDENT_AMBULATORY_CARE_PROVIDER_SITE_OTHER): Payer: BC Managed Care – PPO | Admitting: Family Medicine

## 2011-05-01 VITALS — BP 122/80 | HR 62 | Temp 98.6°F | Wt 233.5 lb

## 2011-05-01 DIAGNOSIS — M5412 Radiculopathy, cervical region: Secondary | ICD-10-CM

## 2011-05-01 DIAGNOSIS — G542 Cervical root disorders, not elsewhere classified: Secondary | ICD-10-CM

## 2011-05-01 MED ORDER — ALPRAZOLAM 0.25 MG PO TABS: ORAL_TABLET | ORAL | Status: DC

## 2011-05-01 NOTE — Progress Notes (Signed)
  Subjective:    Patient ID: Darren Wilson, male    DOB: 1960-07-27, 51 y.o.   MRN: [id number]  HPI  51 yo here for urgent care follow up.  On Monday morning was driving to work and felt pain in neck with numbness that travelled down his entire right arm to in his 2nd finger.   Finger also looked bluish.  Symptoms have resolved.  Per pt, EKG was normal.  No lab work done (still awaiting records).  Has had some URI symptoms recently and increased stressors at work.  No UE weakness. No decreased grip strength.  Patient Active Problem List  Diagnoses  . MIGRAINE HEADACHE  . INSOMNIA  . RUQ PAIN  . Routine general medical examination at a health care facility  . IBS (irritable bowel syndrome)  . Cervical nerve root impingement   Past Medical History  Diagnosis Date  . Migraines    No past surgical history on file. History  Substance Use Topics  . Smoking status: Never Smoker   . Smokeless tobacco: Not on file  . Alcohol Use: No   Family History  Problem Relation Age of Onset  . Heart attack Father    Allergies  Allergen Reactions  . Tetracyclines & Related Other (See Comments)    GI upset   Current Outpatient Prescriptions on File Prior to Visit  Medication Sig Dispense Refill  . propranolol (INDERAL) 20 MG tablet As directed, as needed.  90 tablet  11  . SUMAtriptan (IMITREX) 100 MG tablet Take 1 tablet (100 mg total) by mouth every 2 (two) hours as needed.  10 tablet  1   The PMH, PSH, Social History, Family History, Medications, and allergies have been reviewed in [facility], and have been updated if relevant.   Review of Systems See HPI  No CP No CP No diaphoresis or nausea    Objective:   Physical Exam BP 122/80  Pulse 62  Temp(Src) 98.6 F (37 C) (Oral)  Wt 233 lb 8 oz (105.915 kg)  General:  Well-developed,well-nourished,in no acute distress; alert,appropriate and cooperative throughout examination Head:  normocephalic and atraumatic.   Lungs:   Normal respiratory effort, chest expands symmetrically. Lungs are clear to auscultation, no crackles or wheezes. Heart:  Normal rate and regular rhythm. S1 and S2 normal without gallop, murmur, click, rub or other extra sounds. Abdomen:  Bowel sounds positive,abdomen soft and non-tender without masses, organomegaly or hernias noted. Msk:  No deformity or scoliosis noted of thoracic or lumbar spine.   Extremities:  No clubbing, cyanosis, edema, or deformity noted with normal full range of motion of all joints.  Neg arch, neg empty can. Neurologic:  alert & oriented X3 and gait normal.   Skin:  Intact without suspicious lesions or rashes Psych:  Cognition and judgment appear intact. Alert and cooperative with normal attention span and concentration. No apparent delusions, illusions, hallucinations    Assessment & Plan:   1. Cervical nerve root impingement    New- likely C5. Discussed ergonomics at work.  He actually has a department that helps with that through his employer.  No red flag symptoms requiring imaging at this time. The patient indicates understanding of these issues and agrees with the plan.

## 2011-08-03 ENCOUNTER — Telehealth: Payer: Self-pay | Admitting: Family Medicine

## 2011-08-03 NOTE — Telephone Encounter (Signed)
Advised patient, he doesn't want anything for nausea at this time, will continue to drink fluids.  Advised against carbonated fluids, suggested water, fruit juice, gatorade instead.

## 2011-08-03 NOTE — Telephone Encounter (Signed)
Caller: [name]/Patient; PCP: Ruthe Mannan ([facility]); CB#: [phone]; ; ; Call regarding Diarrhea;  Had vomiting 4-14. Has diarrhea 4-15 and has had 20 diarrhea stools. Afebrile. Has voided. Has taken Imodium 3 tabs but has not helped. Has cramping in abdomen. States cannot stand "vertical" without having stool. Is drinking fluids. No available appointment for 4-15. Wants something called in to help. States does not think could get to office without having stool.   PLEASE CALL PT AND ADVISE CAN CALL IN SOMETHING VS BEING SEEN IN OFFICE [phone]

## 2011-08-03 NOTE — Telephone Encounter (Signed)
Agreed -

## 2011-08-03 NOTE — Telephone Encounter (Signed)
It sounds infectious and unfortuately, there is no antidiarrhea medication for infectious diarrhea.  Immondium or any medication like it will not help and is not recommended.  If he is still nauseated, we can call in zofran or phenergan.  Make sure he is staying as hydrated as possible.  If his symptoms worsen or he if he feels dehydrated, needs to go to ER for fluids.

## 2011-08-20 DIAGNOSIS — N400 Enlarged prostate without lower urinary tract symptoms: Secondary | ICD-10-CM | POA: Insufficient documentation

## 2011-08-31 DIAGNOSIS — F4322 Adjustment disorder with anxiety: Secondary | ICD-10-CM | POA: Insufficient documentation

## 2011-11-24 ENCOUNTER — Other Ambulatory Visit: Payer: Self-pay | Admitting: Internal Medicine

## 2011-11-24 DIAGNOSIS — J320 Chronic maxillary sinusitis: Secondary | ICD-10-CM

## 2011-11-26 ENCOUNTER — Other Ambulatory Visit: Payer: BC Managed Care – PPO

## 2012-09-01 ENCOUNTER — Other Ambulatory Visit: Payer: Self-pay | Admitting: Family Medicine

## 2012-09-01 NOTE — Telephone Encounter (Signed)
[place] with [facility] left v/m requesting call back with more specific instructions for propranolol and days supply.Please advise.

## 2012-09-01 NOTE — Telephone Encounter (Signed)
Pt was last seen 1/13 and has no upcoming appts scheduled.

## 2012-09-01 NOTE — Telephone Encounter (Signed)
He takes one tablet as needed prior to public speaking.  1 month supply with 3 refills.

## 2012-09-02 NOTE — Telephone Encounter (Signed)
Advised pharmacist at [facility].

## 2012-10-25 DIAGNOSIS — K581 Irritable bowel syndrome with constipation: Secondary | ICD-10-CM | POA: Insufficient documentation

## 2012-11-27 ENCOUNTER — Other Ambulatory Visit: Payer: Self-pay | Admitting: Family Medicine

## 2012-11-28 NOTE — Telephone Encounter (Signed)
Refill request for imitrex.  Patient has not been seen since 1/13 and hasnt had a physical since 2012.  Please advise.

## 2013-09-29 ENCOUNTER — Other Ambulatory Visit: Payer: Self-pay | Admitting: Family Medicine

## 2013-09-29 NOTE — Telephone Encounter (Signed)
Last filled 11/27/12

## 2014-04-06 DIAGNOSIS — G479 Sleep disorder, unspecified: Secondary | ICD-10-CM | POA: Insufficient documentation

## 2014-08-27 ENCOUNTER — Other Ambulatory Visit: Payer: Self-pay | Admitting: Family Medicine

## 2014-08-27 NOTE — Telephone Encounter (Signed)
Ok to refill one time only. MUST BE SEEN FOR FURTHER REFILLS.

## 2014-08-27 NOTE — Telephone Encounter (Signed)
Received refill request electronically Last refill 09/29/13 #10 Last office visit 05/01/11/acute Is it okay to refill medication?

## 2015-06-03 DIAGNOSIS — Z125 Encounter for screening for malignant neoplasm of prostate: Secondary | ICD-10-CM | POA: Insufficient documentation

## 2015-06-03 DIAGNOSIS — Z119 Encounter for screening for infectious and parasitic diseases, unspecified: Secondary | ICD-10-CM | POA: Insufficient documentation

## 2015-06-03 DIAGNOSIS — L570 Actinic keratosis: Secondary | ICD-10-CM | POA: Insufficient documentation

## 2015-06-03 DIAGNOSIS — J301 Allergic rhinitis due to pollen: Secondary | ICD-10-CM | POA: Insufficient documentation

## 2016-04-21 DIAGNOSIS — K219 Gastro-esophageal reflux disease without esophagitis: Secondary | ICD-10-CM | POA: Insufficient documentation

## 2017-08-10 DIAGNOSIS — N486 Induration penis plastica: Secondary | ICD-10-CM | POA: Insufficient documentation

## 2017-09-27 DIAGNOSIS — N400 Enlarged prostate without lower urinary tract symptoms: Secondary | ICD-10-CM | POA: Diagnosis not present

## 2017-09-27 DIAGNOSIS — Z6829 Body mass index (BMI) 29.0-29.9, adult: Secondary | ICD-10-CM | POA: Diagnosis not present

## 2017-09-27 DIAGNOSIS — N401 Enlarged prostate with lower urinary tract symptoms: Secondary | ICD-10-CM | POA: Diagnosis not present

## 2017-09-27 DIAGNOSIS — N486 Induration penis plastica: Secondary | ICD-10-CM | POA: Diagnosis not present

## 2018-02-01 DIAGNOSIS — Z125 Encounter for screening for malignant neoplasm of prostate: Secondary | ICD-10-CM | POA: Diagnosis not present

## 2018-02-01 DIAGNOSIS — Z0000 Encounter for general adult medical examination without abnormal findings: Secondary | ICD-10-CM | POA: Diagnosis not present

## 2018-02-01 DIAGNOSIS — Z13220 Encounter for screening for lipoid disorders: Secondary | ICD-10-CM | POA: Diagnosis not present

## 2018-02-01 DIAGNOSIS — L570 Actinic keratosis: Secondary | ICD-10-CM | POA: Diagnosis not present

## 2018-02-01 DIAGNOSIS — Z1283 Encounter for screening for malignant neoplasm of skin: Secondary | ICD-10-CM | POA: Diagnosis not present

## 2018-02-01 DIAGNOSIS — Z131 Encounter for screening for diabetes mellitus: Secondary | ICD-10-CM | POA: Diagnosis not present

## 2018-03-01 DIAGNOSIS — L814 Other melanin hyperpigmentation: Secondary | ICD-10-CM | POA: Diagnosis not present

## 2018-03-01 DIAGNOSIS — L910 Hypertrophic scar: Secondary | ICD-10-CM | POA: Diagnosis not present

## 2018-03-01 DIAGNOSIS — Z1283 Encounter for screening for malignant neoplasm of skin: Secondary | ICD-10-CM | POA: Diagnosis not present

## 2018-03-01 DIAGNOSIS — L570 Actinic keratosis: Secondary | ICD-10-CM | POA: Diagnosis not present

## 2018-03-03 DIAGNOSIS — M79642 Pain in left hand: Secondary | ICD-10-CM | POA: Insufficient documentation

## 2018-03-03 DIAGNOSIS — S60222A Contusion of left hand, initial encounter: Secondary | ICD-10-CM | POA: Diagnosis not present

## 2018-03-09 DIAGNOSIS — K5909 Other constipation: Secondary | ICD-10-CM | POA: Diagnosis not present

## 2018-03-09 DIAGNOSIS — K219 Gastro-esophageal reflux disease without esophagitis: Secondary | ICD-10-CM | POA: Diagnosis not present

## 2018-07-05 DIAGNOSIS — N451 Epididymitis: Secondary | ICD-10-CM | POA: Insufficient documentation

## 2018-07-05 DIAGNOSIS — Z6827 Body mass index (BMI) 27.0-27.9, adult: Secondary | ICD-10-CM | POA: Diagnosis not present

## 2018-09-28 DIAGNOSIS — R1031 Right lower quadrant pain: Secondary | ICD-10-CM | POA: Diagnosis not present

## 2018-09-28 DIAGNOSIS — N50811 Right testicular pain: Secondary | ICD-10-CM | POA: Diagnosis not present

## 2018-09-28 DIAGNOSIS — K5904 Chronic idiopathic constipation: Secondary | ICD-10-CM | POA: Diagnosis not present

## 2018-09-29 ENCOUNTER — Other Ambulatory Visit: Payer: Self-pay | Admitting: Physician Assistant

## 2018-09-29 DIAGNOSIS — R1031 Right lower quadrant pain: Secondary | ICD-10-CM

## 2018-10-03 ENCOUNTER — Ambulatory Visit
Admission: RE | Admit: 2018-10-03 | Discharge: 2018-10-03 | Disposition: A | Discharge status: Home / Self Care | Payer: BLUE CROSS/BLUE SHIELD | Source: Ambulatory Visit | Attending: Physician Assistant | Admitting: Physician Assistant

## 2018-10-03 ENCOUNTER — Other Ambulatory Visit: Payer: Self-pay

## 2018-10-03 DIAGNOSIS — R1031 Right lower quadrant pain: Secondary | ICD-10-CM | POA: Diagnosis not present

## 2018-10-03 IMAGING — CT CT ABDOMEN AND PELVIS WITH CONTRAST
1 of 3 series · 14 of 32 positions shown, 19 images · IV contrast (APPLIED)
Comparison: CT abdomen and pelvis [DATE].

CLINICAL DATA: Right lower quadrant pain for 3 weeks. Question
appendicitis.

EXAM:
CT ABDOMEN AND PELVIS WITH CONTRAST
TECHNIQUE: Multidetector CT imaging of the abdomen and pelvis was performed
using the standard protocol following bolus administration of
intravenous contrast.
CONTRAST:  125 mL [PR] IOPAMIDOL ([PR]) INJECTION 61%

[Series 2: abd/pelvis w/cm · axial · 0.79mm/px · z∈[+345,+770]mm · 14 of 97 slices shown, 19 images]
[im 6/97  soft-tissue]
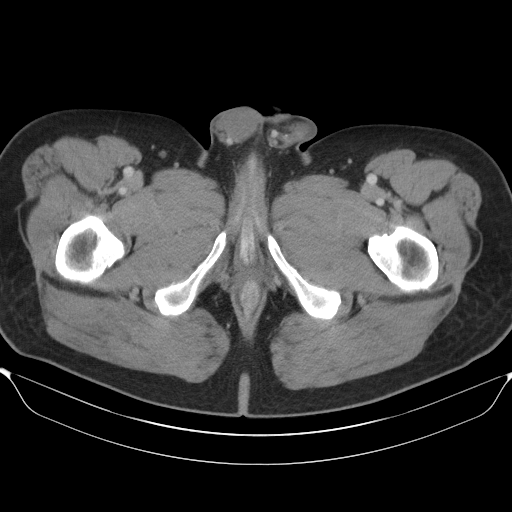
[im 6/97  bone]
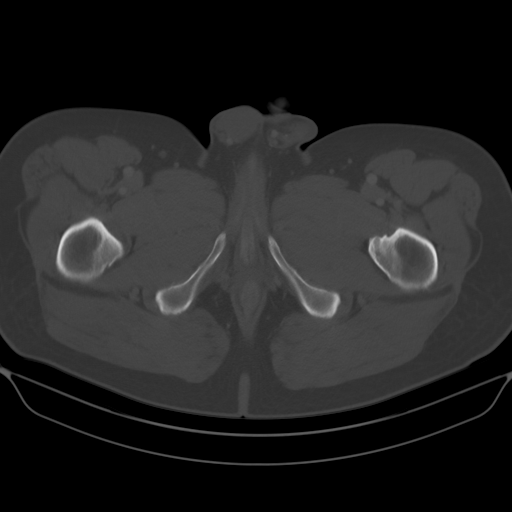
[im 12/97  soft-tissue]
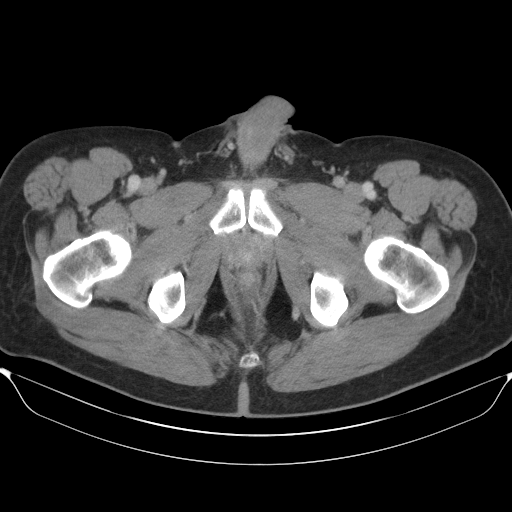
[im 23/97  soft-tissue]
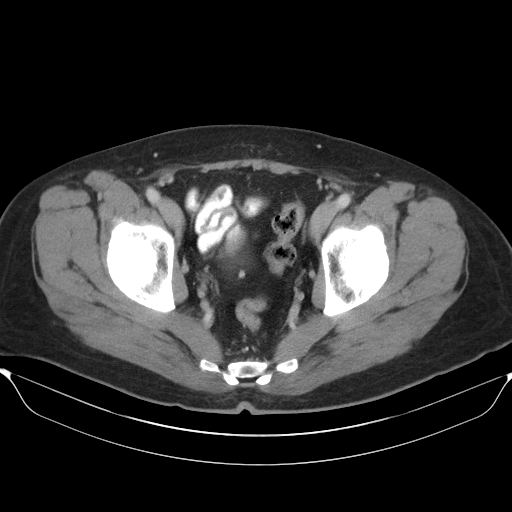
[im 29/97  soft-tissue]
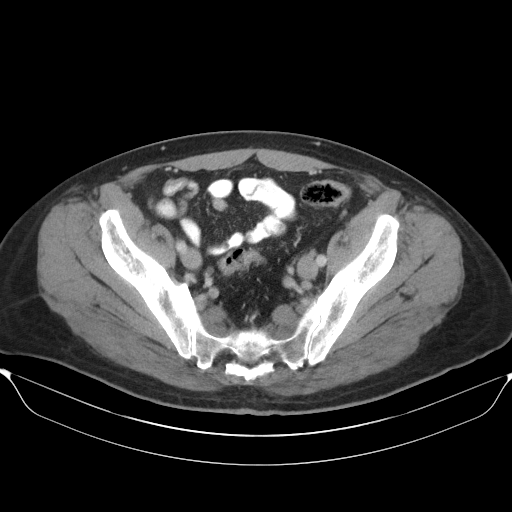
[im 34/97  soft-tissue]
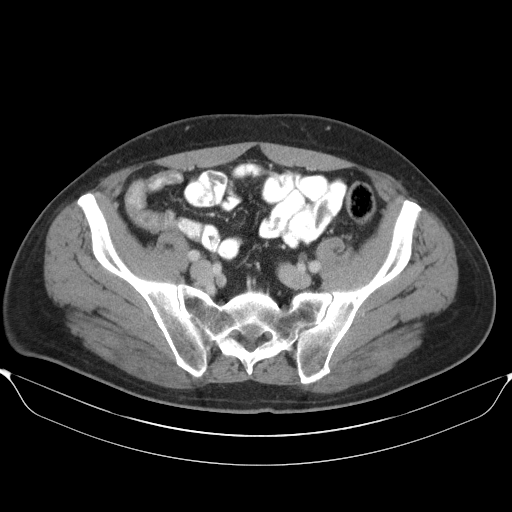
[im 40/97  soft-tissue]
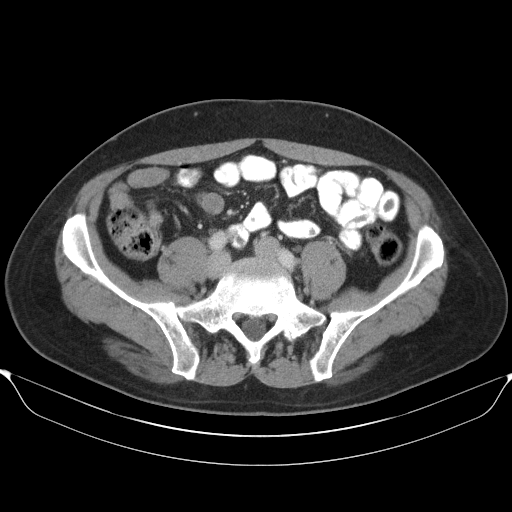
[im 51/97  soft-tissue]
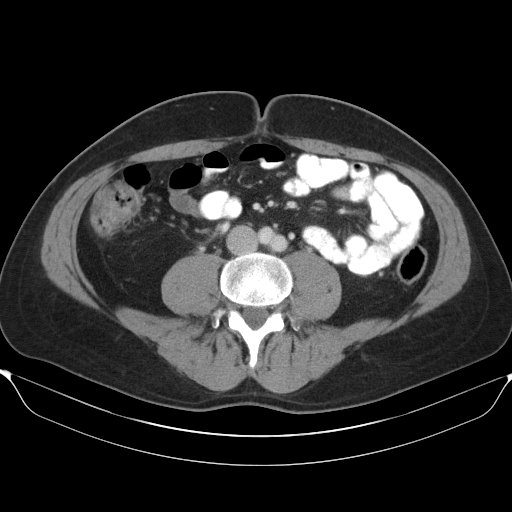
[im 57/97  soft-tissue]
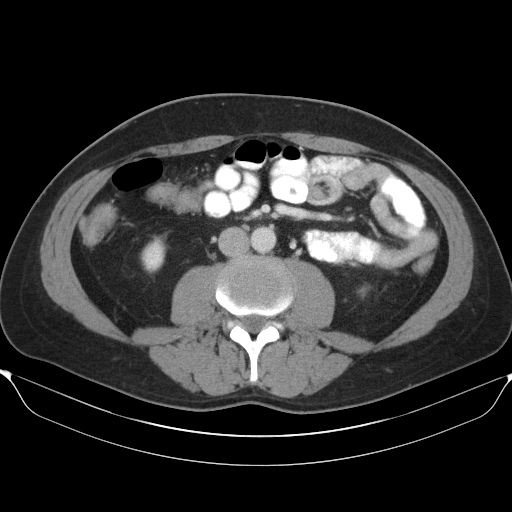
[im 63/97  soft-tissue]
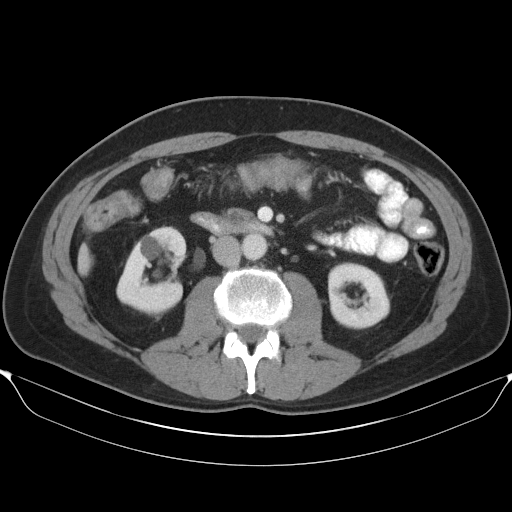
[im 63/97  bone]
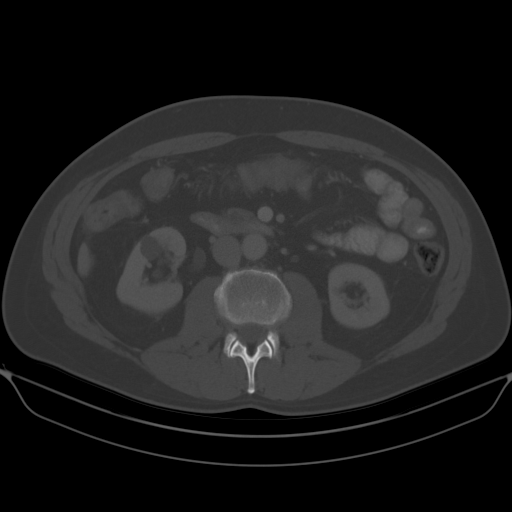
[im 68/97  soft-tissue]
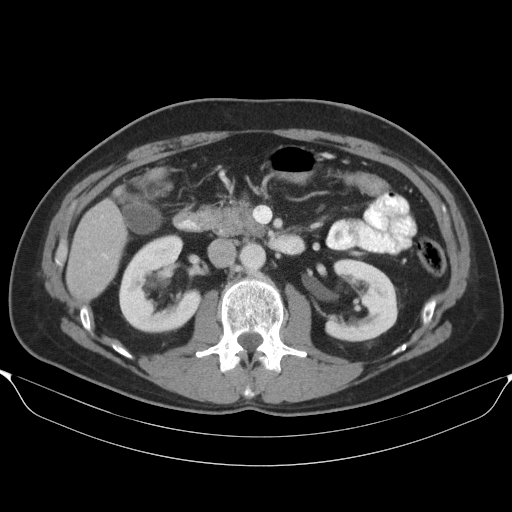
[im 74/97  soft-tissue]
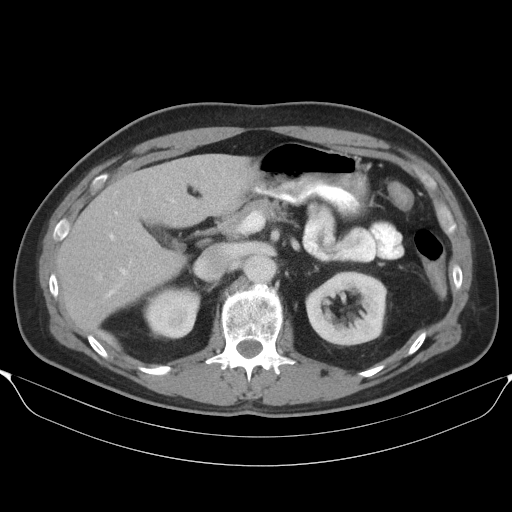
[im 74/97  lung]
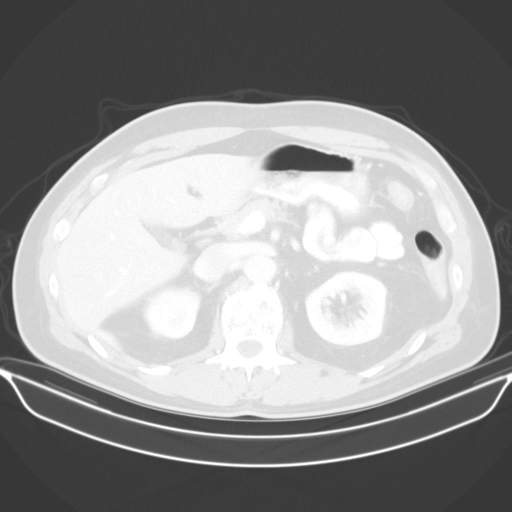
[im 80/97  lung]
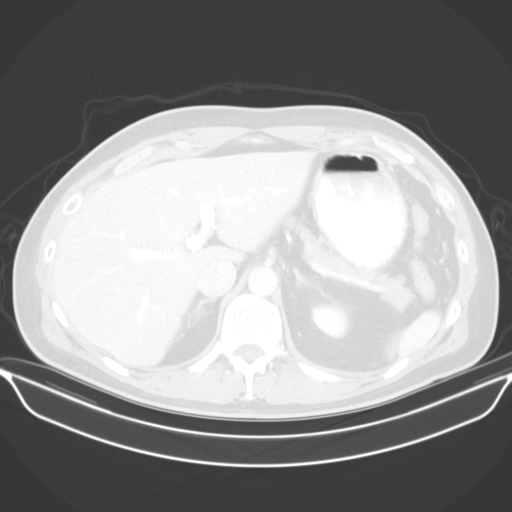
[im 85/97  soft-tissue]
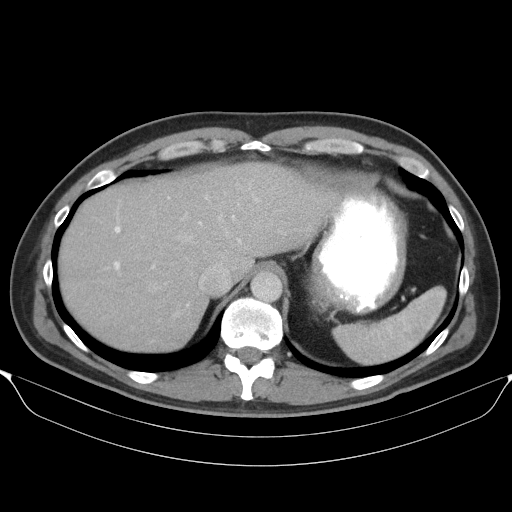
[im 85/97  lung]
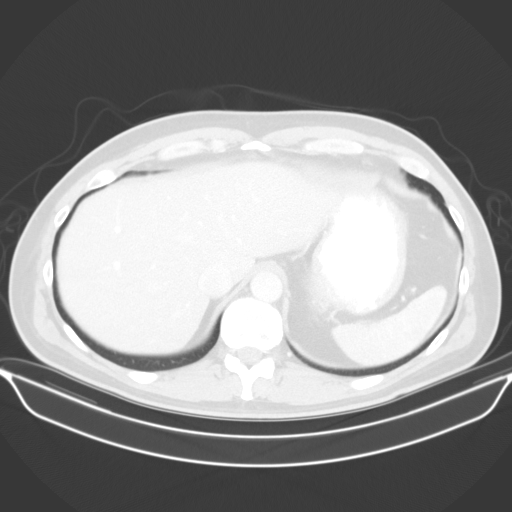
[im 91/97  soft-tissue]
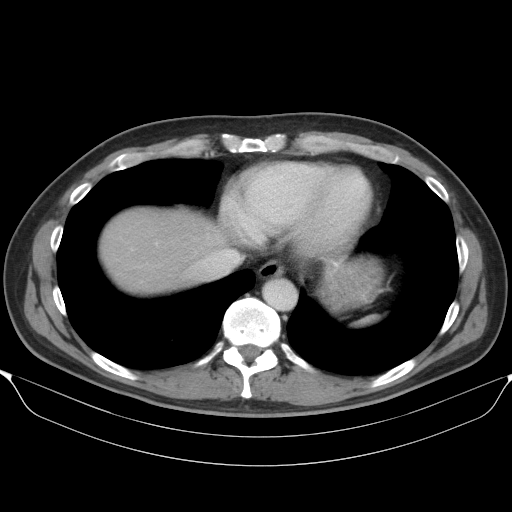
[im 91/97  lung]
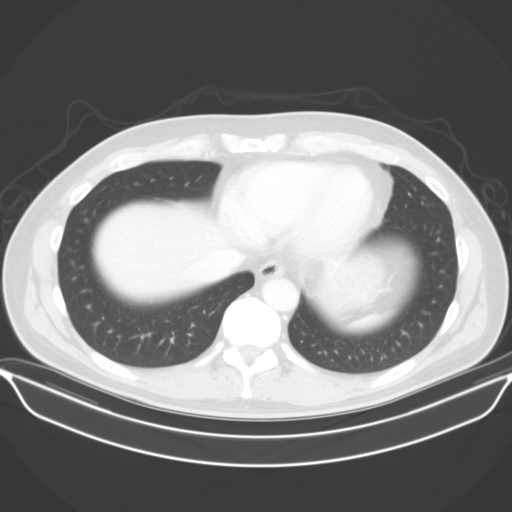

[14 of 32 positions shown; findings below may reference images not displayed]

FINDINGS: Lower chest: Lung bases clear.  No pleural or pericardial effusion.

Hepatobiliary: No focal liver abnormality is seen. No gallstones,
gallbladder wall thickening, or biliary dilatation.

Pancreas: Unremarkable. No pancreatic ductal dilatation or
surrounding inflammatory changes.

Spleen: Normal in size without focal abnormality.

Adrenals/Urinary Tract: The adrenal glands appear normal. Small
renal cysts are seen bilaterally and have increased in number and
size slightly since the prior exam. No worrisome renal lesion is
identified. No hydronephrosis. Ureters and urinary bladder appear
normal.

Stomach/Bowel: Stomach is within normal limits. Appendix appears
normal. No evidence of bowel wall thickening, distention, or
inflammatory changes.

Vascular/Lymphatic: No significant vascular findings are present. No
enlarged abdominal or pelvic lymph nodes.

Reproductive: Prostate is unremarkable.

Other: None.

Musculoskeletal: No acute or focal abnormality.
IMPRESSION: Negative for appendicitis. Negative CT abdomen and pelvis. No
finding to explain the patient's symptoms.

## 2018-10-03 MED ORDER — IOPAMIDOL (ISOVUE-300) INJECTION 61%
125.0000 mL | Freq: Once | INTRAVENOUS | Status: AC | PRN
  Administered 2018-10-03: 125 mL via INTRAVENOUS

## 2018-11-29 DIAGNOSIS — M79604 Pain in right leg: Secondary | ICD-10-CM | POA: Diagnosis not present

## 2018-11-29 DIAGNOSIS — M25561 Pain in right knee: Secondary | ICD-10-CM | POA: Insufficient documentation

## 2018-12-07 DIAGNOSIS — M898X6 Other specified disorders of bone, lower leg: Secondary | ICD-10-CM | POA: Diagnosis not present

## 2018-12-14 DIAGNOSIS — M1711 Unilateral primary osteoarthritis, right knee: Secondary | ICD-10-CM | POA: Diagnosis not present

## 2018-12-14 DIAGNOSIS — S83241A Other tear of medial meniscus, current injury, right knee, initial encounter: Secondary | ICD-10-CM | POA: Diagnosis not present

## 2018-12-14 DIAGNOSIS — M79604 Pain in right leg: Secondary | ICD-10-CM | POA: Diagnosis not present

## 2019-01-25 DIAGNOSIS — R1031 Right lower quadrant pain: Secondary | ICD-10-CM | POA: Diagnosis not present

## 2019-01-25 DIAGNOSIS — K5904 Chronic idiopathic constipation: Secondary | ICD-10-CM | POA: Diagnosis not present

## 2019-03-01 DIAGNOSIS — Z0000 Encounter for general adult medical examination without abnormal findings: Secondary | ICD-10-CM | POA: Diagnosis not present

## 2019-03-01 DIAGNOSIS — Z125 Encounter for screening for malignant neoplasm of prostate: Secondary | ICD-10-CM | POA: Diagnosis not present

## 2019-03-01 DIAGNOSIS — Z23 Encounter for immunization: Secondary | ICD-10-CM | POA: Diagnosis not present

## 2019-03-28 DIAGNOSIS — L814 Other melanin hyperpigmentation: Secondary | ICD-10-CM | POA: Diagnosis not present

## 2019-03-28 DIAGNOSIS — L84 Corns and callosities: Secondary | ICD-10-CM | POA: Diagnosis not present

## 2019-03-28 DIAGNOSIS — L570 Actinic keratosis: Secondary | ICD-10-CM | POA: Diagnosis not present

## 2019-03-28 DIAGNOSIS — L821 Other seborrheic keratosis: Secondary | ICD-10-CM | POA: Diagnosis not present

## 2019-04-06 DIAGNOSIS — Y999 Unspecified external cause status: Secondary | ICD-10-CM | POA: Diagnosis not present

## 2019-04-06 DIAGNOSIS — X58XXXA Exposure to other specified factors, initial encounter: Secondary | ICD-10-CM | POA: Diagnosis not present

## 2019-04-06 DIAGNOSIS — S83231A Complex tear of medial meniscus, current injury, right knee, initial encounter: Secondary | ICD-10-CM | POA: Diagnosis not present

## 2019-04-11 DIAGNOSIS — Z20828 Contact with and (suspected) exposure to other viral communicable diseases: Secondary | ICD-10-CM | POA: Diagnosis not present

## 2019-04-20 DIAGNOSIS — M25561 Pain in right knee: Secondary | ICD-10-CM | POA: Diagnosis not present

## 2019-04-27 DIAGNOSIS — M25561 Pain in right knee: Secondary | ICD-10-CM | POA: Diagnosis not present

## 2019-05-03 DIAGNOSIS — M25561 Pain in right knee: Secondary | ICD-10-CM | POA: Diagnosis not present

## 2019-05-10 DIAGNOSIS — M25561 Pain in right knee: Secondary | ICD-10-CM | POA: Diagnosis not present

## 2019-06-24 ENCOUNTER — Other Ambulatory Visit (HOSPITAL_COMMUNITY): Payer: BC Managed Care – PPO

## 2019-06-24 ENCOUNTER — Emergency Department (HOSPITAL_COMMUNITY): Payer: BC Managed Care – PPO

## 2019-06-24 ENCOUNTER — Emergency Department (HOSPITAL_COMMUNITY)
Admission: EM | Admit: 2019-06-24 | Discharge: 2019-06-24 | Disposition: A | Discharge status: Home / Self Care | Payer: BC Managed Care – PPO | Attending: Emergency Medicine | Admitting: Emergency Medicine

## 2019-06-24 ENCOUNTER — Encounter (HOSPITAL_COMMUNITY): Payer: Self-pay | Admitting: Emergency Medicine

## 2019-06-24 ENCOUNTER — Other Ambulatory Visit: Payer: Self-pay

## 2019-06-24 DIAGNOSIS — R791 Abnormal coagulation profile: Secondary | ICD-10-CM | POA: Diagnosis not present

## 2019-06-24 DIAGNOSIS — H532 Diplopia: Secondary | ICD-10-CM | POA: Insufficient documentation

## 2019-06-24 DIAGNOSIS — R42 Dizziness and giddiness: Secondary | ICD-10-CM | POA: Diagnosis not present

## 2019-06-24 DIAGNOSIS — Z79899 Other long term (current) drug therapy: Secondary | ICD-10-CM | POA: Diagnosis not present

## 2019-06-24 DIAGNOSIS — R519 Headache, unspecified: Secondary | ICD-10-CM | POA: Diagnosis not present

## 2019-06-24 DIAGNOSIS — H539 Unspecified visual disturbance: Secondary | ICD-10-CM

## 2019-06-24 DIAGNOSIS — G43909 Migraine, unspecified, not intractable, without status migrainosus: Secondary | ICD-10-CM | POA: Diagnosis not present

## 2019-06-24 LAB — CBC WITH DIFFERENTIAL/PLATELET
Abs Immature Granulocytes: 0.02 10*3/uL (ref 0.00–0.07)
Basophils Absolute: 0.1 10*3/uL (ref 0.0–0.1)
Basophils Relative: 1 %
Eosinophils Absolute: 0.2 10*3/uL (ref 0.0–0.5)
Eosinophils Relative: 4 %
HCT: 43.2 % (ref 39.0–52.0)
Hemoglobin: 14.2 g/dL (ref 13.0–17.0)
Immature Granulocytes: 0 %
Lymphocytes Relative: 36 %
Lymphs Abs: 2.1 10*3/uL (ref 0.7–4.0)
MCH: 32.6 pg (ref 26.0–34.0)
MCHC: 32.9 g/dL (ref 30.0–36.0)
MCV: 99.1 fL (ref 80.0–100.0)
Monocytes Absolute: 1.2 10*3/uL — ABNORMAL HIGH (ref 0.1–1.0)
Monocytes Relative: 20 %
Neutro Abs: 2.3 10*3/uL (ref 1.7–7.7)
Neutrophils Relative %: 39 %
Platelets: 208 10*3/uL (ref 150–400)
RBC: 4.36 MIL/uL (ref 4.22–5.81)
RDW: 12.4 % (ref 11.5–15.5)
WBC: 5.8 10*3/uL (ref 4.0–10.5)
nRBC: 0 % (ref 0.0–0.2)

## 2019-06-24 LAB — PROTIME-INR
INR: 0.9 (ref 0.8–1.2)
Prothrombin Time: 11.6 seconds (ref 11.4–15.2)

## 2019-06-24 LAB — BASIC METABOLIC PANEL
Anion gap: 11 (ref 5–15)
BUN: 20 mg/dL (ref 6–20)
CO2: 23 mmol/L (ref 22–32)
Calcium: 9.5 mg/dL (ref 8.9–10.3)
Chloride: 103 mmol/L (ref 98–111)
Creatinine, Ser: 1.1 mg/dL (ref 0.61–1.24)
GFR calc Af Amer: >60 mL/min (ref >60)
GFR calc non Af Amer: >60 mL/min (ref >60)
Glucose, Bld: 145 mg/dL — ABNORMAL HIGH (ref 70–99)
Potassium: 4.1 mmol/L (ref 3.5–5.1)
Sodium: 137 mmol/L (ref 135–145)

## 2019-06-24 LAB — CBG MONITORING, ED: Glucose-Capillary: 136 mg/dL — ABNORMAL HIGH (ref 70–99)

## 2019-06-24 IMAGING — CT CT ANGIO HEAD
1 of 11 series · 5 of 33 positions shown · IV contrast (OMNI 350)
Comparison: None.

CLINICAL DATA: Headache, dizziness, and right eye visual changes

EXAM:
CT ANGIOGRAPHY HEAD AND NECK
TECHNIQUE: Multidetector CT imaging of the head and neck was performed using
the standard protocol during bolus administration of intravenous
contrast. Multiplanar CT image reconstructions and MIPs were
obtained to evaluate the vascular anatomy. Carotid stenosis
measurements (when applicable) are obtained utilizing NASCET
criteria, using the distal internal carotid diameter as the
denominator.
CONTRAST:  50mL OMNIPAQUE IOHEXOL 350 MG/ML SOLN

[Series 11: cta neck axial · axial · 0.44mm/px · z∈[-310,-54]mm · 5 of 383 slices shown]
[im 64/383  soft-tissue]
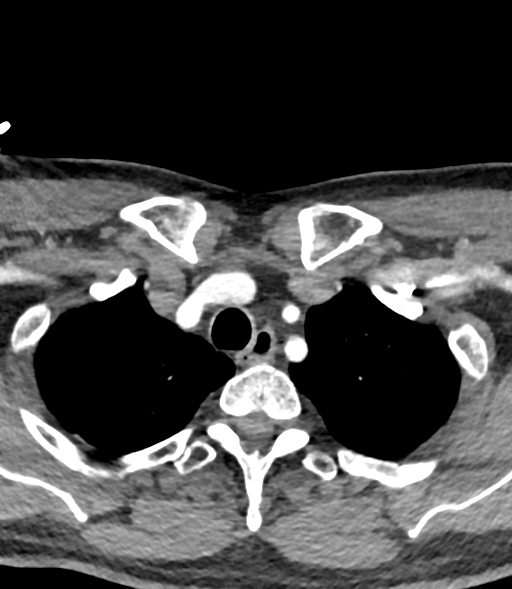
[im 128/383  bone]
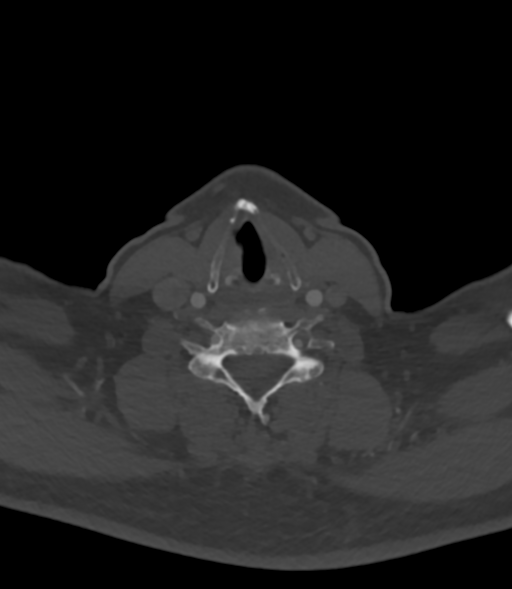
[im 192/383  soft-tissue]
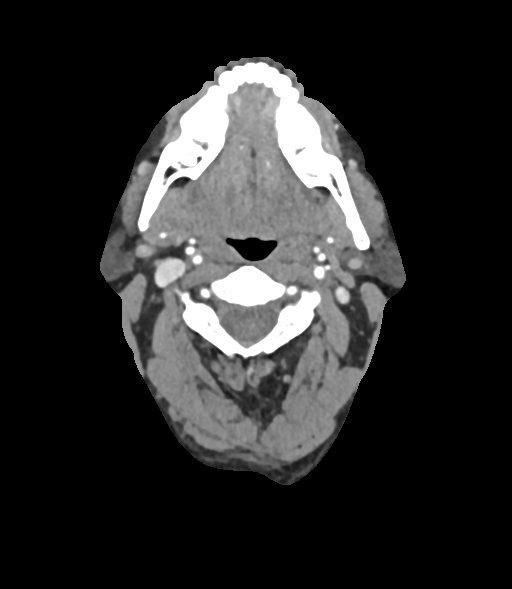
[im 255/383  bone]
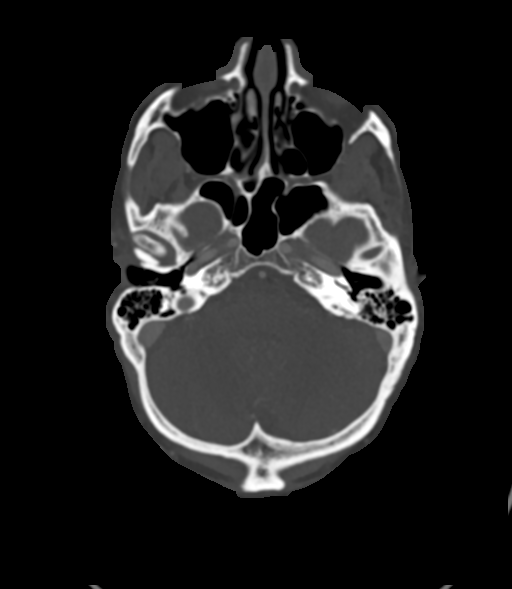
[im 319/383  soft-tissue]
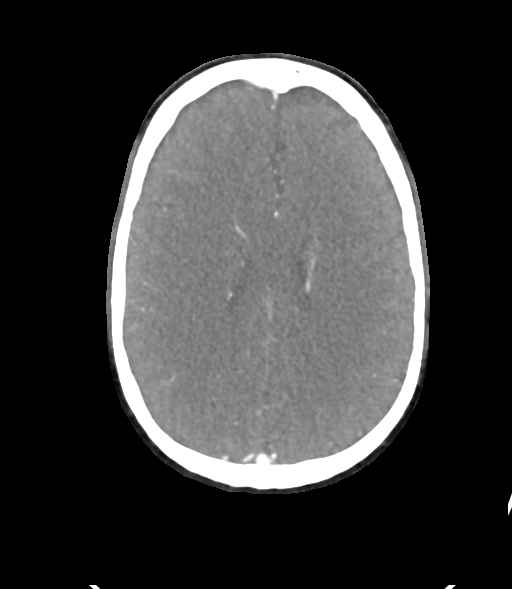

[5 of 33 positions shown; findings below may reference images not displayed]

FINDINGS: CT HEAD FINDINGS

Brain: There is no acute intracranial hemorrhage, mass effect, or
edema. Gray-white differentiation is preserved. Ventricles and sulci
are within normal limits in size and configuration. No extra-axial
fluid collection.

Vascular: No hyperdense vessel.

Skull: Unremarkable.

Sinuses: Aerated.

Orbits: Unremarkable.

Review of the MIP images confirms the above findings

CTA NECK FINDINGS

Aortic arch: Great vessel origins are patent.

Right carotid system: Patent.  No measurable stenosis.

Left carotid system: Patent. Minimal calcified plaque at the ICA
origin. No measurable stenosis.

Vertebral arteries: Patent.  Codominant.

Skeleton: Mild cervical spine degenerative changes.

Other neck: No mass or adenopathy.

Upper chest: Included upper lungs are clear.

Review of the MIP images confirms the above findings

CTA HEAD FINDINGS

Anterior circulation: Intracranial internal carotid arteries are
patent. Anterior and middle cerebral arteries are patent.

Posterior circulation: Intracranial vertebral arteries, basilar
artery, and posterior cerebral arteries are patent. There is a left
posterior communicating artery present.

Venous sinuses: As permitted by contrast timing, patent.

Review of the MIP images confirms the above findings
IMPRESSION: No acute intracranial abnormality.

No hemodynamically significant stenosis or evidence of dissection in
the neck.

No proximal intracranial vessel occlusion or significant stenosis.

## 2019-06-24 IMAGING — MR MR HEAD WO/W CM
14 of 16 series · 40 of 48 positions shown · IV contrast (Gadavist)
Comparison: CT angiogram head/neck performed earlier the same day
[DATE].

CLINICAL DATA: Focal neuro deficit, greater than 6 hours, stroke
suspected. Additional history provided: Visual disturbance onset
yesterday afternoon, dizziness

EXAM:
MRI HEAD WITHOUT AND WITH CONTRAST
TECHNIQUE: Multiplanar, multiecho pulse sequences of the brain and surrounding
structures were obtained without and with intravenous contrast.
CONTRAST:  9.9mL GADAVIST GADOBUTROL 1 MMOL/ML IV SOLN

[Series 5: DWI · axial · 3.0mm · 0.88mm/px · z∈[-84,+63]mm · 5 of 100 slices shown (1 of 4)]
[im 1/100]
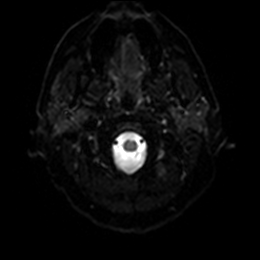
[im 25/100]
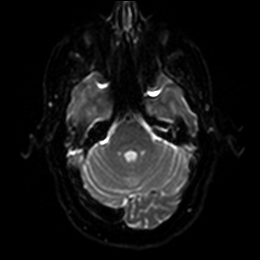
[im 50/100]
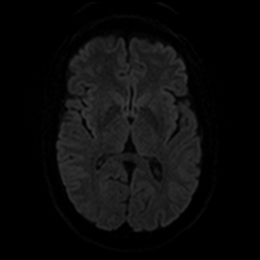
[im 75/100]
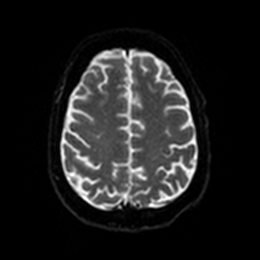
[im 100/100]
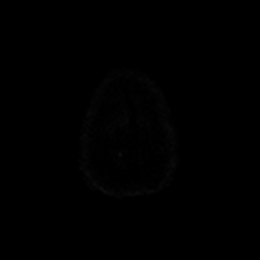

[Series 6: DWI · axial · 3.0mm · 0.88mm/px · z∈[-84,+63]mm · 2 of 48 slices shown (2 of 4)]
[im 1/48]
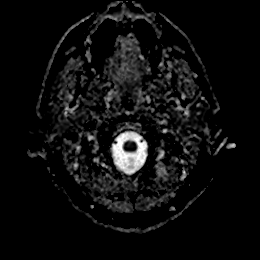
[im 48/48]
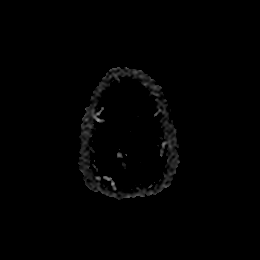

[Series 7: DWI · coronal · 4.0mm · 0.88mm/px · 4 of 76 slices shown (3 of 4)]
[im 1/76]
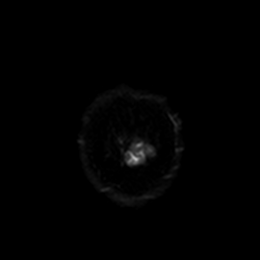
[im 26/76]
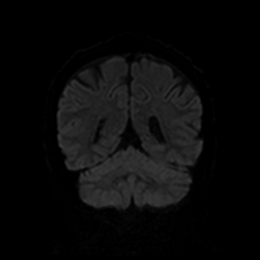
[im 51/76]
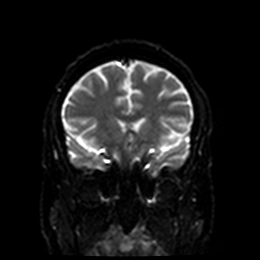
[im 76/76]
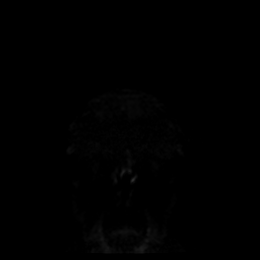

[Series 8: DWI · coronal · 4.0mm · 0.88mm/px · 2 of 38 slices shown (4 of 4)]
[im 1/38]
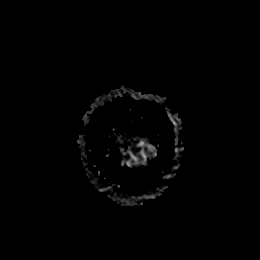
[im 38/38]
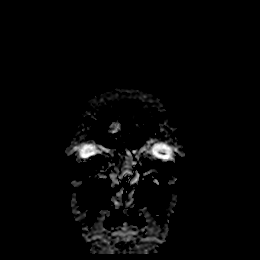

[Series 9: T1 · sagittal · 5.0mm · 0.75mm/px · 2 of 25 slices shown]
[im 1/25]
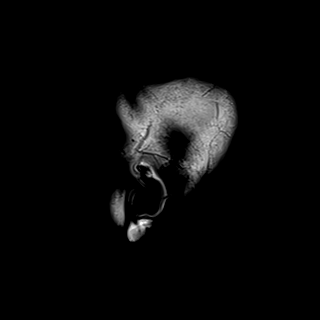
[im 25/25]
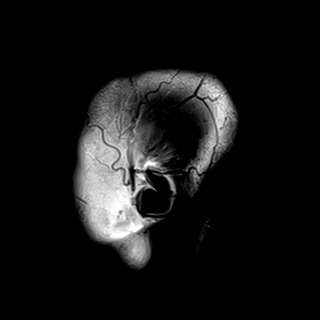

[Series 10: T2 · axial · 5.0mm · 0.72mm/px · z∈[-82,+62]mm · 2 of 25 slices shown]
[im 1/25]
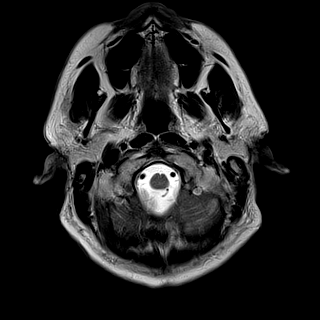
[im 25/25]
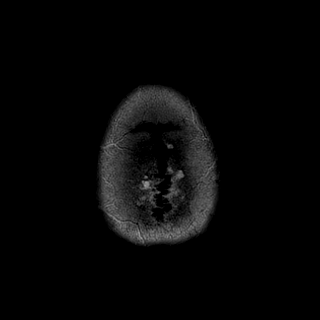

[Series 11: FLAIR · axial · 5.0mm · 0.45mm/px · z∈[-82,+61]mm · 2 of 25 slices shown]
[im 1/25]
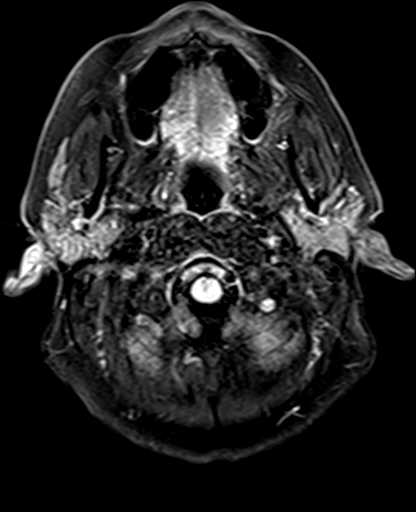
[im 25/25]
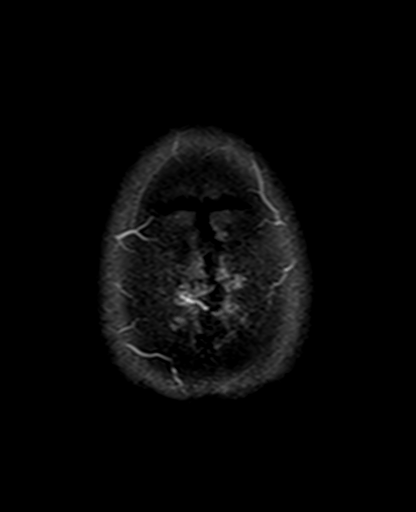

[Series 12: mag_images · axial · 3.0mm · 0.90mm/px · z∈[-99,+78]mm · 4 of 60 slices shown]
[im 1/60]
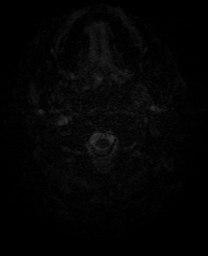
[im 20/60]
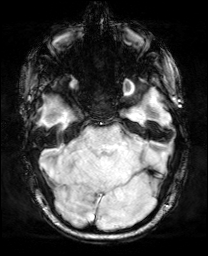
[im 40/60]
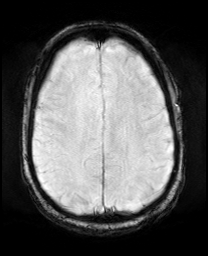
[im 60/60]
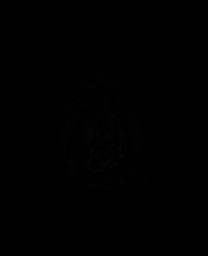

[Series 13: pha_images · axial · 3.0mm · 0.90mm/px · z∈[-99,+78]mm · 4 of 60 slices shown]
[im 1/60]
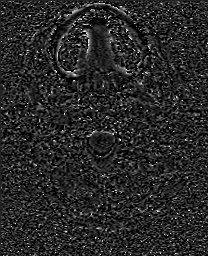
[im 20/60]
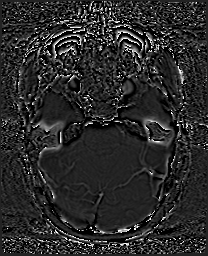
[im 40/60]
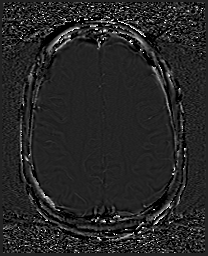
[im 60/60]
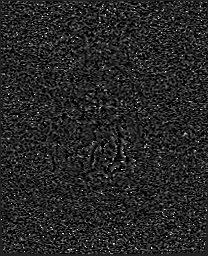

[Series 14: swi_images · axial · 3.0mm · 0.90mm/px · z∈[-99,+78]mm · 4 of 60 slices shown]
[im 1/60]
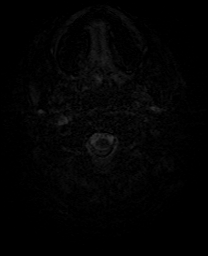
[im 20/60]
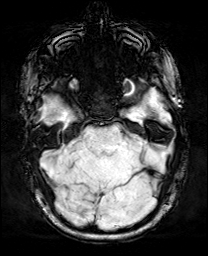
[im 40/60]
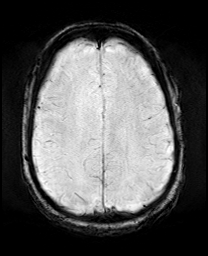
[im 60/60]
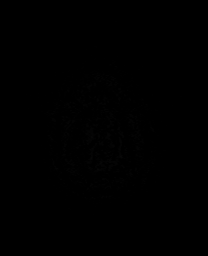

[Series 15: mip_images(sw) · axial · 24.0mm · 0.90mm/px · z∈[-88,+67]mm · 3 of 53 slices shown]
[im 1/53]
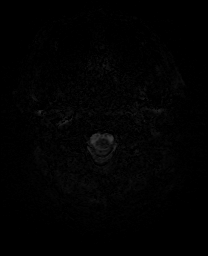
[im 27/53]
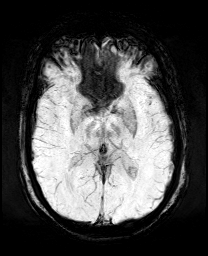
[im 53/53]
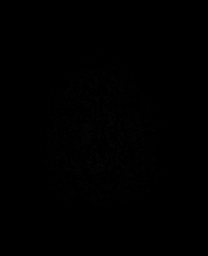

[Series 17: T2 post-contrast · coronal · 5.0mm · 0.72mm/px · 2 of 33 slices shown]
[im 1/33]
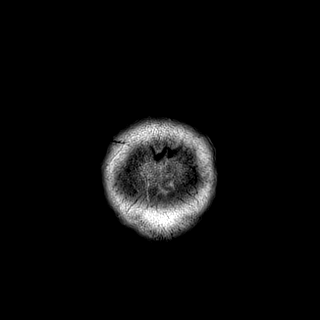
[im 33/33]
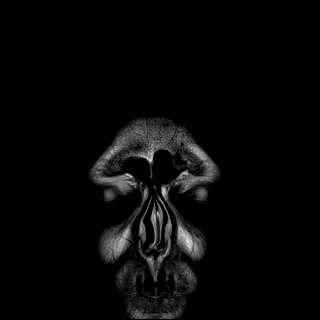

[Series 19: T1 post-contrast · coronal · 5.0mm · 0.34mm/px · 2 of 33 slices shown (1 of 2)]
[im 1/33]
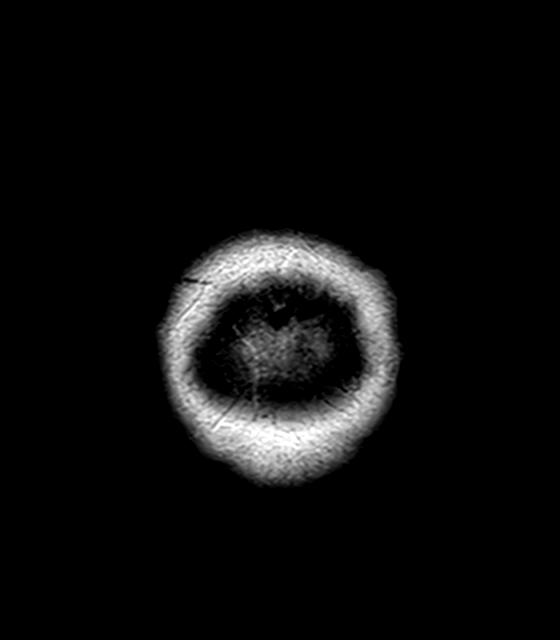
[im 33/33]
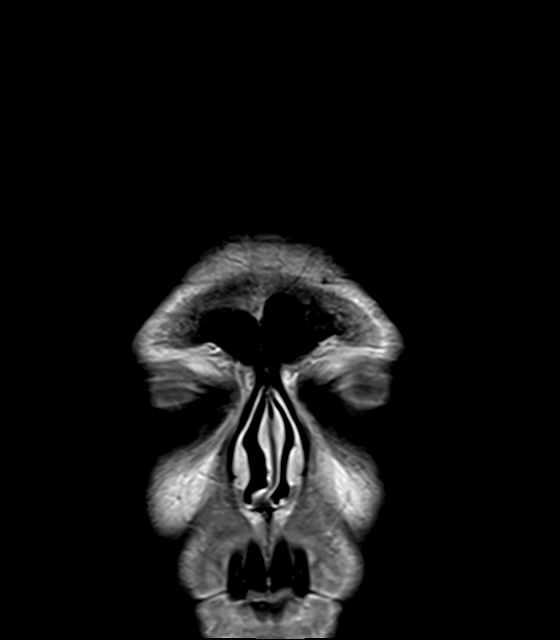

[Series 20: T1 post-contrast · sagittal · 5.0mm · 0.72mm/px · 2 of 25 slices shown (2 of 2)]
[im 1/25]
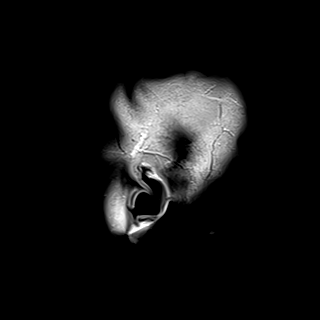
[im 25/25]
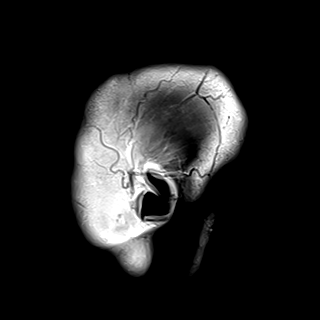

[40 of 48 positions shown; findings below may reference images not displayed]

FINDINGS: Brain:

There is no evidence of acute infarct.

No midline shift or extra-axial fluid collection.

No chronic intracranial blood products.

Within the left olfactory fossa, there is a 7 mm focus of
precontrast T1 hyperintensity, predominantly T2 hyperintensity and
T2 FLAIR hyperintensity. Precontrast T1 hyperintensity limits
evaluation for associated enhanced (series 11, image 11) (series 9,
image 13) (series 17, image 29) (series 19, image 29). There is no
corresponding restricted diffusion.

No significant white matter disease.

No abnormal enhancement demonstrated elsewhere within the brain.

Cerebral volume is normal for age.

Vascular: Flow voids maintained within the proximal large arterial
vessels.

Skull and upper cervical spine: No focal marrow lesion.

Sinuses/Orbits: Visualized orbits demonstrate no acute abnormality.
Minimal ethmoid sinus mucosal thickening. Small amount of fluid
within left mastoid air cells.
IMPRESSION: No evidence of acute infarct.

7 mm focus of signal abnormality within the left olfactory fossa as
detailed. Precontrast T1 hyperintensity limits evaluation for
associated enhancement. This finding is indeterminate and may be
secondary to benign or malignant etiologies. 4 week
contrast-enhanced MRI follow-up is recommended to assess for
stability. High-resolution T2 weighted imaging through the anterior
cranial fossa should be performed with this examination (CISS
sequence).

Otherwise unremarkable MRI appearance of the brain for age.

Mild paranasal sinus mucosal thickening. Small left mastoid
effusion.

## 2019-06-24 MED ORDER — MAGNESIUM SULFATE 2 GM/50ML IV SOLN
2.0000 g | Freq: Once | INTRAVENOUS | Status: AC
  Administered 2019-06-24: 2 g via INTRAVENOUS
  Filled 2019-06-24: qty 50

## 2019-06-24 MED ORDER — PROCHLORPERAZINE EDISYLATE 10 MG/2ML IJ SOLN
10.0000 mg | Freq: Once | INTRAMUSCULAR | Status: AC
  Administered 2019-06-24: 10 mg via INTRAVENOUS
  Filled 2019-06-24: qty 2

## 2019-06-24 MED ORDER — GADOBUTROL 1 MMOL/ML IV SOLN
9.9000 mL | Freq: Once | INTRAVENOUS | Status: AC | PRN
  Administered 2019-06-24: 9.9 mL via INTRAVENOUS

## 2019-06-24 MED ORDER — IOHEXOL 350 MG/ML SOLN
50.0000 mL | Freq: Once | INTRAVENOUS | Status: AC | PRN
  Administered 2019-06-24: 50 mL via INTRAVENOUS

## 2019-06-24 MED ORDER — DIPHENHYDRAMINE HCL 50 MG/ML IJ SOLN
12.5000 mg | Freq: Once | INTRAMUSCULAR | Status: AC
  Administered 2019-06-24: 12.5 mg via INTRAVENOUS
  Filled 2019-06-24: qty 1

## 2019-06-24 NOTE — ED Provider Notes (Signed)
  ED Course/Procedures   Clinical Course as of Jun 23 1799  Sat Jun 24, 2019  1306 59yo male with complaint of right eye visual floaters and blurry vision. Floaters started yesterday around 2PM with fatigue, awoke this morning with generalized headache, increase in number of floaters and blurry vision. Also felt light headed, numbness in right chest and right arm.  Exam unremarkable. CTA head/neck negative. Consult with Dr. Laurence Slate with neurology, recommends MRI with contrast to rule out PCA stroke. If negative, recommends migraine cocktail with magnesium.    [LM]  1522 Care signed out at end of shift awaiting MRI.    [LM]    Clinical Course User Index [LM] Jeannie Fend, PA-C    Medical Decision Making: Care of patient assumed from Army Melia PA-C at 1500.  Agree with history, physical exam and plan.  See their note for further details.  Briefly, 59 y.o. male with PMH/PSH as below.  Past Medical History:  Diagnosis Date  . Migraines    History reviewed. No pertinent surgical history.   Patient HDS at handoff.  Plan at time of handoff:  Floaters in right eye yesterday.    Headache, resolved with ibuprofen.  Was lightheaded but resolved.  Floaters and blurry vision.  Maybe some numbness right chest.   Neurology was consulted by prior team, recommend MRI, then migraine cocktail if negative.    ED Course MR without acute CVA, does have 33mm focus in olfactory fossa which is nonspecific and radiology recommends repeat MRI in 4 weeks, this was discussed with the pt.    Ocular POCUS done as below, no evidence of retinal detachment.  Headache resolved after compazine, benadryl and magnesium, but does still have some blurred vision.    Rechecked the pt who is resting comfortably.  Given ophthalmology follow-up.  Will follow up with PCP and ophthalmology.  Strict return precautions given.  Discharged in stable condition.    Clinical Impression Visual Disturbance  Ultrasound ED  Ocular  Date/Time: 06/24/2019 6:01 PM Performed by: Tobe Kervin, [place], MD Authorized by: Blane Ohara, MD   PROCEDURE DETAILS:    Indications: visual change     Assessed:  Right eye   Right eye axial view: obtained     Right eye sagittal view: obtained     Images: archived     Limitations:  None RIGHT EYE FINDINGS:     no foreign body noted in right eye    right eye lens not dislodged    no right eye increased optic nerve sheath diameter    no evidence of retinal detachment of the right eye    no vitreous hemorrhage in right eye      Ole Lafon, [place], MD 06/24/19 2313    Blane Ohara, MD 06/24/19 2329

## 2019-06-24 NOTE — ED Triage Notes (Addendum)
Patient reports noticing some "floaters" in his R eye yesterday afternoon around 2pm, states that upon waking this morning around 3am, his vision was worse and he noticed lots of "black spots" in his visual field on the R side. Pt also endorses some R arm tingling and R sided headache upon waking. Pt a/ox4, face symmetrical, speech clear, moves all limbs equally with equal grip strength. Pt reports history of migraines in the past.

## 2019-06-24 NOTE — Discharge Instructions (Signed)
  Your MRI brain showed the below abnormality as we discussed.  Please call your primary care provider on Monday to follow up and to schedule repeat MRI as noted below.  Also call the ophthalmologist listed to set up repeat evaluation for your floaters as soon as possible.     7 mm focus of signal abnormality within the left olfactory fossa as  detailed. Precontrast T1 hyperintensity limits evaluation for  associated enhancement. This finding is indeterminate and may be  secondary to benign or malignant etiologies. 4 week  contrast-enhanced MRI follow-up is recommended to assess for  stability. High-resolution T2 weighted imaging through the anterior  cranial fossa should be performed with this examination (CISS  sequence).

## 2019-06-24 NOTE — ED Notes (Signed)
Wife would like an update [phone].

## 2019-06-24 NOTE — ED Provider Notes (Signed)
[facility] EMERGENCY DEPARTMENT Provider Note   CSN: [id number] Arrival date & time: 06/24/19  1829     History Chief Complaint  Patient presents with  . Diplopia    Darren Wilson is a 59 y.o. male.  59yo male with complaint of visual disturbance onset yesterday afternoon at 2pm, "looked like a rat running across the floor." Today noticed multiple floaters only in the right eye, felt more fatigued, blurry vision only in the right eye. Number of floaters has decreased as of this time. Right side chest and triceps area feel tingly. Slight headache this morning, generalized, history of migraines but does not feel like a migraine, took ibuprofen and headache and pain resolved. Felt slightly dizzy today- feeling like might pass out. Right hand dominant, no arm weakness, no leg weakness or changes in gait. No changes in speech but reports difficulty with word recall yesterday which he attributed to a long work week.  No chronic health problems, never smoked.  Family history, father MI in early 29s, prostate cancer. Maternal grandmother with CVA in 86s.        Past Medical History:  Diagnosis Date  . Migraines     Patient Active Problem List   Diagnosis Date Noted  . Cervical nerve root impingement 05/01/2011  . IBS (irritable bowel syndrome) 03/05/2011  . Routine general medical examination at a health care facility 02/24/2011  . MIGRAINE HEADACHE 07/29/2009  . INSOMNIA 07/29/2009  . RUQ PAIN 07/29/2009    History reviewed. No pertinent surgical history.     Family History  Problem Relation Age of Onset  . Heart attack Father     Social History   Tobacco Use  . Smoking status: Never Smoker  Substance Use Topics  . Alcohol use: No  . Drug use: No    Home Medications Prior to Admission medications   Medication Sig Start Date End Date Taking? Authorizing Provider  clonazePAM (KLONOPIN) 0.5 MG tablet Take 0.5 mg by mouth daily as needed.  03/21/19  Yes [provider]  finasteride (PROSCAR) 5 MG tablet Take 2.5 mg by mouth daily.  04/18/19  Yes [provider]  loratadine (CLARITIN) 10 MG tablet Take 10 mg by mouth daily as needed.  02/16/14  Yes [provider]  OVER THE COUNTER MEDICATION Place 1 drop into both eyes daily as needed (for dryness of eyes). [employer] brand dry eye drops   Yes [provider]  pantoprazole (PROTONIX) 20 MG tablet Take 20 mg by mouth daily as needed.   Yes [provider]  propranolol (INDERAL) 20 MG tablet TAKE AS DIRECTED AS NEEDED Patient taking differently: Take 20 mg by mouth daily as needed.  09/01/12  Yes Dianne Dun, MD  ALPRAZolam Prudy Feeler) 0.25 MG tablet 1-2 tablets by mouth daily as needed Patient not taking: Reported on 06/24/2019 05/01/11   Dianne Dun, MD  propranolol (INDERAL) 20 MG tablet As directed, as needed. Patient not taking: Reported on 06/24/2019 03/05/11   Dianne Dun, MD  SUMAtriptan (IMITREX) 100 MG tablet TAKE 1 TABLET (100 MG TOTAL) BY MOUTH EVERY 2 (TWO) HOURS AS NEEDED. Patient taking differently: Take 100 mg by mouth every 2 (two) hours as needed.  09/29/13   Eustaquio Boyden, MD    Allergies    Tetracyclines & related  Review of Systems   Review of Systems  Constitutional: Positive for fatigue. Negative for fever.  Eyes: Positive for visual disturbance. Negative for pain.  Respiratory:  Negative for shortness of breath.   Cardiovascular: Negative for chest pain.  Gastrointestinal: Negative for nausea and vomiting.  Musculoskeletal: Negative for gait problem, neck pain and neck stiffness.  Skin: Negative for rash and wound.  Allergic/Immunologic: Negative for immunocompromised state.  Neurological: Positive for light-headedness, numbness and headaches. Negative for speech difficulty and weakness.  Psychiatric/Behavioral: Negative for confusion.  All other systems reviewed and are negative.   Physical Exam Updated  Vital Signs BP 134/82   Pulse 63   Temp 98.6 F (37 C) (Oral)   Resp 10   Ht 6\' 2"  (1.88 m)   Wt 99.8 kg   SpO2 100%   BMI 28.25 kg/m   Physical Exam Vitals and nursing note reviewed.  Constitutional:      General: He is not in acute distress.    Appearance: He is well-developed. He is not diaphoretic.  HENT:     Head: Normocephalic and atraumatic.     Mouth/Throat:     Mouth: Mucous membranes are moist.     Pharynx: Oropharynx is clear.  Eyes:     General: No visual field deficit.    Extraocular Movements: Extraocular movements intact.     Pupils: Pupils are equal, round, and reactive to light.     Funduscopic exam:    Right eye: Red reflex present.        Left eye: Red reflex present. Cardiovascular:     Rate and Rhythm: Normal rate and regular rhythm.     Pulses: Normal pulses.     Heart sounds: Normal heart sounds.  Pulmonary:     Effort: Pulmonary effort is normal.     Breath sounds: Normal breath sounds.  Musculoskeletal:     Cervical back: Neck supple.     Right lower leg: No edema.     Left lower leg: No edema.  Skin:    General: Skin is warm and dry.     Findings: No erythema or rash.  Neurological:     Mental Status: He is alert and oriented to person, place, and time.     Cranial Nerves: No cranial nerve deficit, dysarthria or facial asymmetry.     Sensory: Sensation is intact. No sensory deficit.     Motor: No weakness, tremor or pronator drift.     Coordination: Coordination normal. Rapid alternating movements normal.  Psychiatric:        Behavior: Behavior normal.     ED Results / Procedures / Treatments   Labs (all labs ordered are listed, but only abnormal results are displayed) Labs Reviewed  BASIC METABOLIC PANEL - Abnormal; Notable for the following components:      Result Value   Glucose, Bld 145 (*)    All other components within normal limits  CBC WITH DIFFERENTIAL/PLATELET - Abnormal; Notable for the following components:    Monocytes Absolute 1.2 (*)    All other components within normal limits  CBG MONITORING, ED - Abnormal; Notable for the following components:   Glucose-Capillary 136 (*)    All other components within normal limits  PROTIME-INR    EKG EKG Interpretation  Date/Time:  Saturday June 24 2019 08:58:41 EST Ventricular Rate:  71 PR Interval:    QRS Duration: 102 QT Interval:  425 QTC Calculation: 462 R Axis:   -24 Text Interpretation: Sinus rhythm Borderline left axis deviation RSR' in V1 or V2, probably normal variant No previous tracing Confirmed by [id number] (Gwyneth Sprout) on 06/24/2019 9:23:34 AM   Radiology CT Angio  Head W/Cm &/Or Wo Cm  Result Date: 06/24/2019 CLINICAL DATA:  Headache, dizziness, and right eye visual changes EXAM: CT ANGIOGRAPHY HEAD AND NECK TECHNIQUE: Multidetector CT imaging of the head and neck was performed using the standard protocol during bolus administration of intravenous contrast. Multiplanar CT image reconstructions and MIPs were obtained to evaluate the vascular anatomy. Carotid stenosis measurements (when applicable) are obtained utilizing NASCET criteria, using the distal internal carotid diameter as the denominator. CONTRAST:  65mL OMNIPAQUE IOHEXOL 350 MG/ML SOLN COMPARISON:  None. FINDINGS: CT HEAD FINDINGS Brain: There is no acute intracranial hemorrhage, mass effect, or edema. Gray-white differentiation is preserved. Ventricles and sulci are within normal limits in size and configuration. No extra-axial fluid collection. Vascular: No hyperdense vessel. Skull: Unremarkable. Sinuses: Aerated. Orbits: Unremarkable. Review of the MIP images confirms the above findings CTA NECK FINDINGS Aortic arch: Great vessel origins are patent. Right carotid system: Patent.  No measurable stenosis. Left carotid system: Patent. Minimal calcified plaque at the ICA origin. No measurable stenosis. Vertebral arteries: Patent.  Codominant. Skeleton: Mild cervical spine degenerative  changes. Other neck: No mass or adenopathy. Upper chest: Included upper lungs are clear. Review of the MIP images confirms the above findings CTA HEAD FINDINGS Anterior circulation: Intracranial internal carotid arteries are patent. Anterior and middle cerebral arteries are patent. Posterior circulation: Intracranial vertebral arteries, basilar artery, and posterior cerebral arteries are patent. There is a left posterior communicating artery present. Venous sinuses: As permitted by contrast timing, patent. Review of the MIP images confirms the above findings IMPRESSION: No acute intracranial abnormality. No hemodynamically significant stenosis or evidence of dissection in the neck. No proximal intracranial vessel occlusion or significant stenosis. Electronically Signed   By: Guadlupe Spanish M.D.   On: 06/24/2019 12:18   CT Angio Neck W and/or Wo Contrast  Result Date: 06/24/2019 CLINICAL DATA:  Headache, dizziness, and right eye visual changes EXAM: CT ANGIOGRAPHY HEAD AND NECK TECHNIQUE: Multidetector CT imaging of the head and neck was performed using the standard protocol during bolus administration of intravenous contrast. Multiplanar CT image reconstructions and MIPs were obtained to evaluate the vascular anatomy. Carotid stenosis measurements (when applicable) are obtained utilizing NASCET criteria, using the distal internal carotid diameter as the denominator. CONTRAST:  47mL OMNIPAQUE IOHEXOL 350 MG/ML SOLN COMPARISON:  None. FINDINGS: CT HEAD FINDINGS Brain: There is no acute intracranial hemorrhage, mass effect, or edema. Gray-white differentiation is preserved. Ventricles and sulci are within normal limits in size and configuration. No extra-axial fluid collection. Vascular: No hyperdense vessel. Skull: Unremarkable. Sinuses: Aerated. Orbits: Unremarkable. Review of the MIP images confirms the above findings CTA NECK FINDINGS Aortic arch: Great vessel origins are patent. Right carotid system: Patent.   No measurable stenosis. Left carotid system: Patent. Minimal calcified plaque at the ICA origin. No measurable stenosis. Vertebral arteries: Patent.  Codominant. Skeleton: Mild cervical spine degenerative changes. Other neck: No mass or adenopathy. Upper chest: Included upper lungs are clear. Review of the MIP images confirms the above findings CTA HEAD FINDINGS Anterior circulation: Intracranial internal carotid arteries are patent. Anterior and middle cerebral arteries are patent. Posterior circulation: Intracranial vertebral arteries, basilar artery, and posterior cerebral arteries are patent. There is a left posterior communicating artery present. Venous sinuses: As permitted by contrast timing, patent. Review of the MIP images confirms the above findings IMPRESSION: No acute intracranial abnormality. No hemodynamically significant stenosis or evidence of dissection in the neck. No proximal intracranial vessel occlusion or significant stenosis. Electronically Signed   By: Elaina Hoops  Patel M.D.   On: 06/24/2019 12:18    Procedures Procedures (including critical care time)  Medications Ordered in ED Medications  iohexol (OMNIPAQUE) 350 MG/ML injection 50 mL (50 mLs Intravenous Contrast Given 06/24/19 1145)    ED Course  I have reviewed the triage vital signs and the nursing notes.  Pertinent labs & imaging results that were available during my care of the patient were reviewed by me and considered in my medical decision making (see chart for details).  Clinical Course as of Jun 23 1520  Sat Jun 24, 2019  1306 59yo male with complaint of right eye visual floaters and blurry vision. Floaters started yesterday around 2PM with fatigue, awoke this morning with generalized headache, increase in number of floaters and blurry vision. Also felt light headed, numbness in right chest and right arm.  Exam unremarkable. CTA head/neck negative. Consult with Dr. Lorraine Lax with neurology, recommends MRI with contrast  to rule out PCA stroke. If negative, recommends migraine cocktail with magnesium.    [LM]  [place] signed out at end of shift awaiting MRI.    [LM]    Clinical Course User Index [LM] Roque Lias   MDM Rules/Calculators/A&P                      Final Clinical Impression(s) / ED Diagnoses Final diagnoses:  Visual disturbance    Rx / DC Orders ED Discharge Orders    None       Tacy Learn, PA-C 06/24/19 1522    Blanchie Dessert, MD 06/25/19 0700

## 2019-06-26 DIAGNOSIS — Z9889 Other specified postprocedural states: Secondary | ICD-10-CM | POA: Diagnosis not present

## 2019-06-26 DIAGNOSIS — H43813 Vitreous degeneration, bilateral: Secondary | ICD-10-CM | POA: Diagnosis not present

## 2019-06-26 DIAGNOSIS — H2513 Age-related nuclear cataract, bilateral: Secondary | ICD-10-CM | POA: Diagnosis not present

## 2019-07-05 DIAGNOSIS — H33311 Horseshoe tear of retina without detachment, right eye: Secondary | ICD-10-CM | POA: Diagnosis not present

## 2019-07-05 DIAGNOSIS — H43813 Vitreous degeneration, bilateral: Secondary | ICD-10-CM | POA: Diagnosis not present

## 2019-07-05 DIAGNOSIS — H43811 Vitreous degeneration, right eye: Secondary | ICD-10-CM | POA: Diagnosis not present

## 2019-07-05 DIAGNOSIS — H4311 Vitreous hemorrhage, right eye: Secondary | ICD-10-CM | POA: Diagnosis not present

## 2019-07-05 DIAGNOSIS — H43812 Vitreous degeneration, left eye: Secondary | ICD-10-CM | POA: Diagnosis not present

## 2019-07-05 DIAGNOSIS — H33321 Round hole, right eye: Secondary | ICD-10-CM | POA: Diagnosis not present

## 2019-07-19 ENCOUNTER — Other Ambulatory Visit: Payer: Self-pay | Admitting: Family Medicine

## 2019-07-19 DIAGNOSIS — R9389 Abnormal findings on diagnostic imaging of other specified body structures: Secondary | ICD-10-CM

## 2019-07-24 ENCOUNTER — Ambulatory Visit
Admission: RE | Admit: 2019-07-24 | Discharge: 2019-07-24 | Disposition: A | Discharge status: Home / Self Care | Payer: BC Managed Care – PPO | Source: Ambulatory Visit | Attending: Family Medicine | Admitting: Family Medicine

## 2019-07-24 ENCOUNTER — Other Ambulatory Visit: Payer: Self-pay

## 2019-07-24 DIAGNOSIS — G939 Disorder of brain, unspecified: Secondary | ICD-10-CM | POA: Diagnosis not present

## 2019-07-24 DIAGNOSIS — R9389 Abnormal findings on diagnostic imaging of other specified body structures: Secondary | ICD-10-CM

## 2019-07-24 DIAGNOSIS — R42 Dizziness and giddiness: Secondary | ICD-10-CM | POA: Diagnosis not present

## 2019-07-24 IMAGING — MR MR HEAD WO/W CM
10 of 12 series · 37 of 48 positions shown · IV contrast (multihance)
Comparison: [DATE]

CLINICAL DATA: Follow-up abnormal MRI. The patient presented to the
emergency department 1 month ago with visual disturbance and
dizziness.

EXAM:
MRI HEAD WITHOUT AND WITH CONTRAST
TECHNIQUE: Multiplanar, multiecho pulse sequences of the brain and surrounding
structures were obtained without and with intravenous contrast.
CONTRAST:  20mL MULTIHANCE GADOBENATE DIMEGLUMINE 529 MG/ML IV SOLN

[Series 2: t1_se_sag · sagittal · 5.0mm · 0.45mm/px · 3 of 21 slices shown]
[im 1/21]
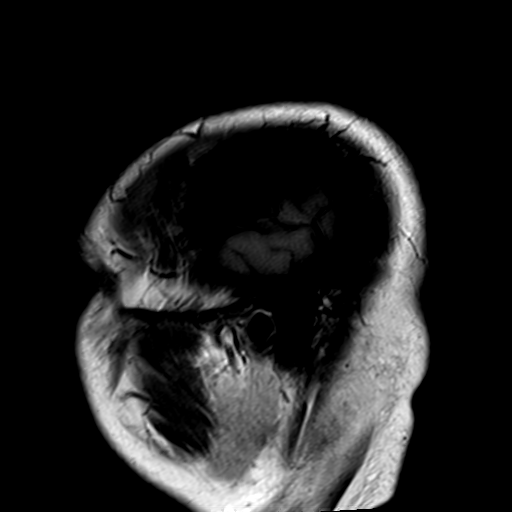
[im 11/21]
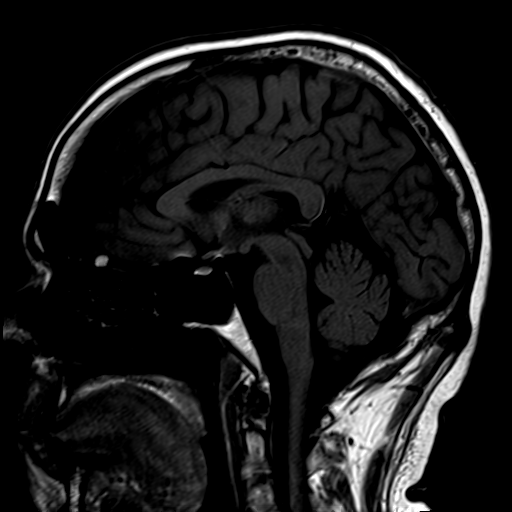
[im 21/21]
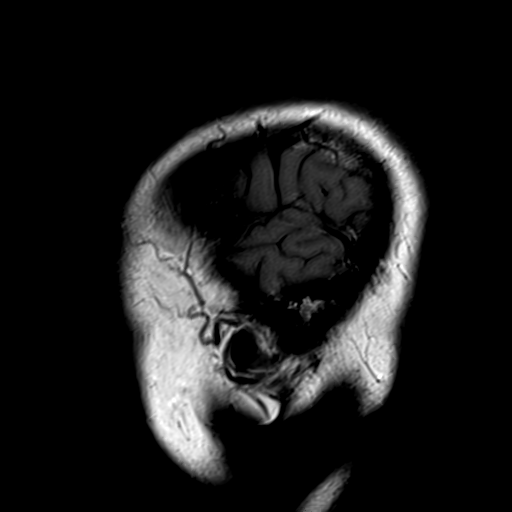

[Series 3: t2_tse_tra_512 · axial · 5.0mm · 0.60mm/px · z∈[-60,+102]mm · 2 of 26 slices shown]
[im 1/26]
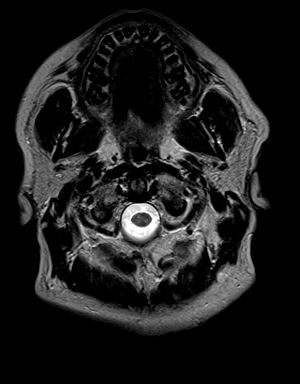
[im 26/26]
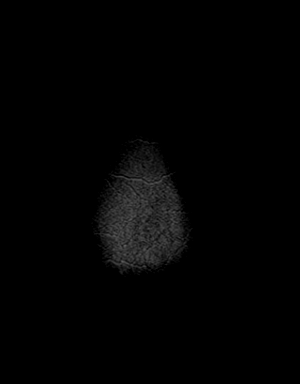

[Series 4: ep2d_diff_3 · axial · 3.0mm · 1.80mm/px · z∈[-53,+94]mm · 8 of 99 slices shown]
[im 1/99]
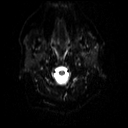
[im 15/99]
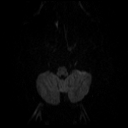
[im 29/99]
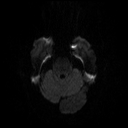
[im 43/99]
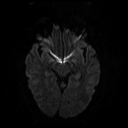
[im 57/99]
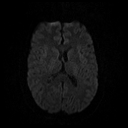
[im 71/99]
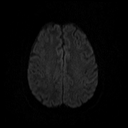
[im 85/99]
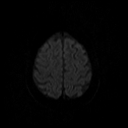
[im 99/99]
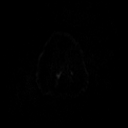

[Series 5: ep2d_diff_3_adc · axial · 3.0mm · 1.80mm/px · z∈[-53,+94]mm · 4 of 45 slices shown]
[im 1/45]
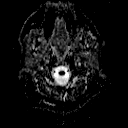
[im 15/45]
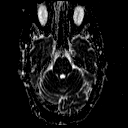
[im 30/45]
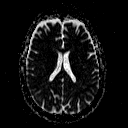
[im 45/45]
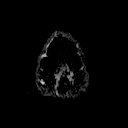

[Series 7: swi_images · axial · 2.0mm · 0.90mm/px · z∈[-58,+100]mm · 7 of 80 slices shown]
[im 1/80]
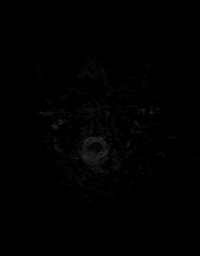
[im 14/80]
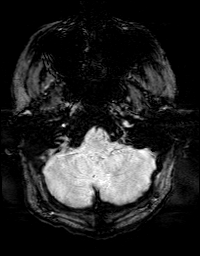
[im 27/80]
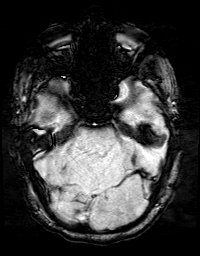
[im 40/80]
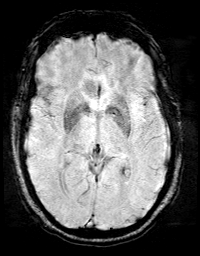
[im 53/80]
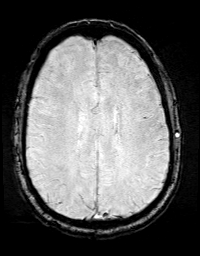
[im 66/80]
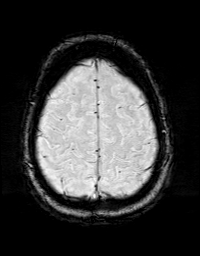
[im 80/80]
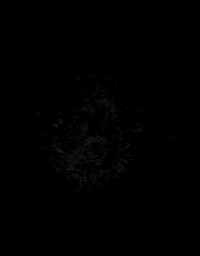

[Series 8: FLAIR · axial · 3.0mm · 0.43mm/px · z∈[-57,+99]mm · 2 of 27 slices shown]
[im 1/27]
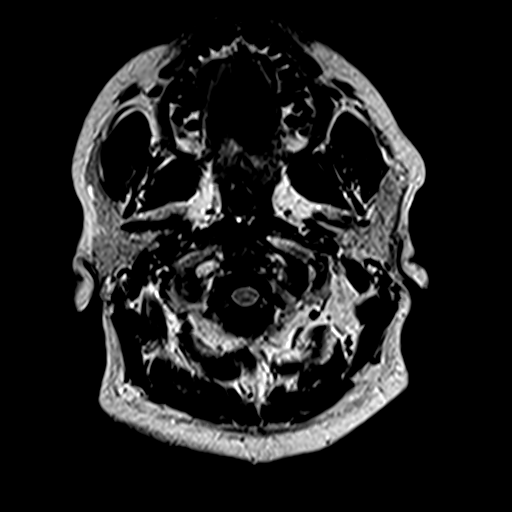
[im 27/27]
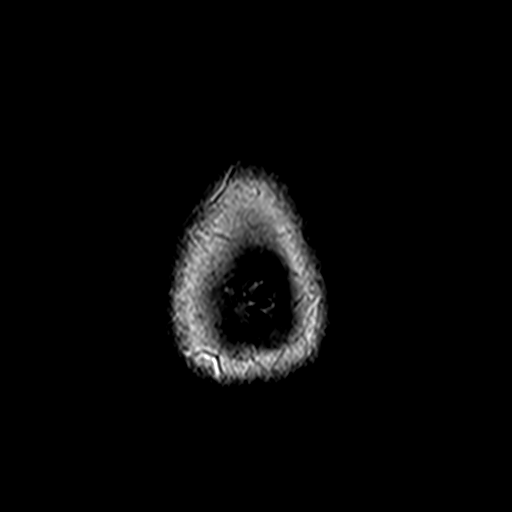

[Series 9: t1_mpr_tra · axial · 2.0mm · 0.45mm/px · z∈[-50,+92]mm · 6 of 72 slices shown]
[im 1/72]
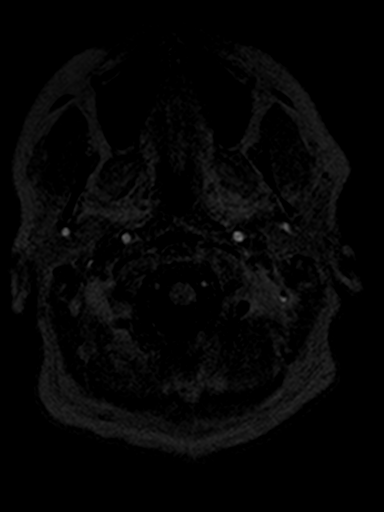
[im 15/72]
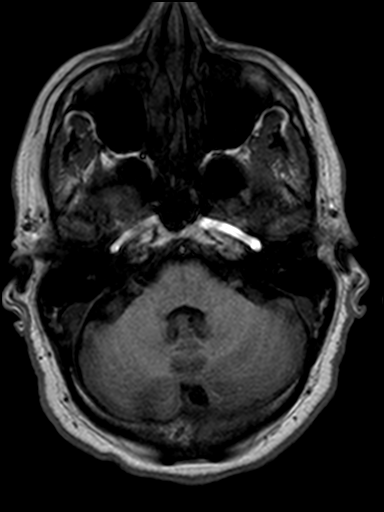
[im 29/72]
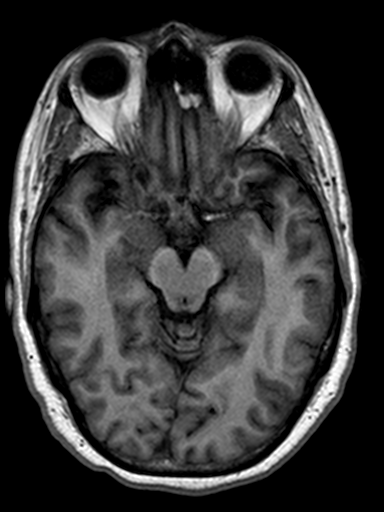
[im 43/72]
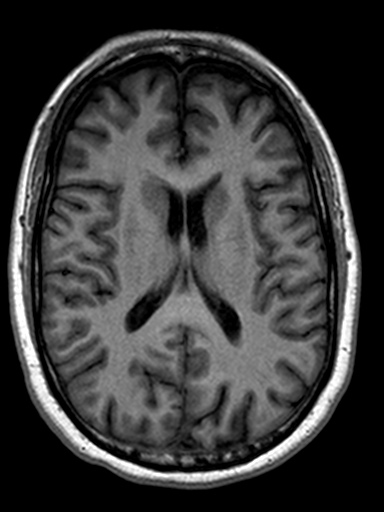
[im 57/72]
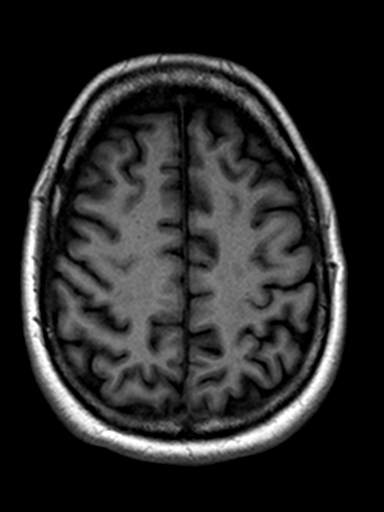
[im 72/72]
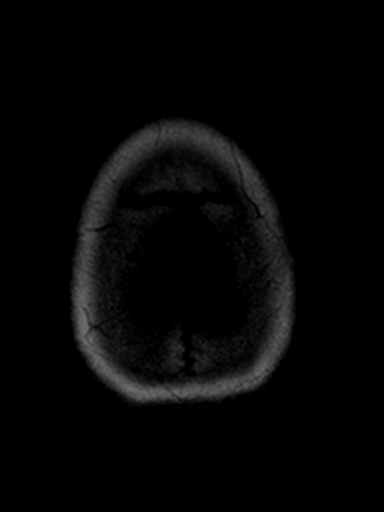

[Series 10: t2_cor_fs_3mm · coronal · 3.0mm · 0.35mm/px · 2 of 28 slices shown]
[im 1/28]
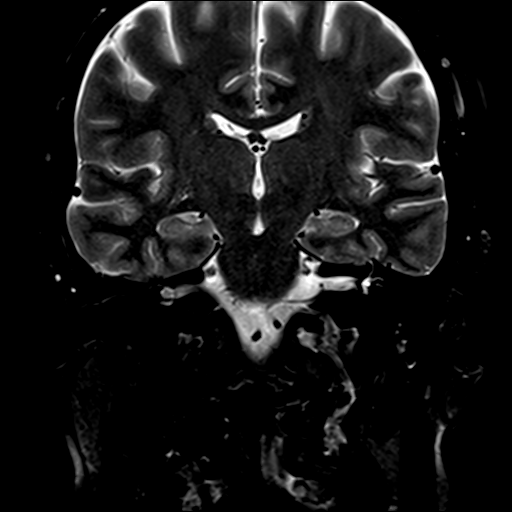
[im 28/28]
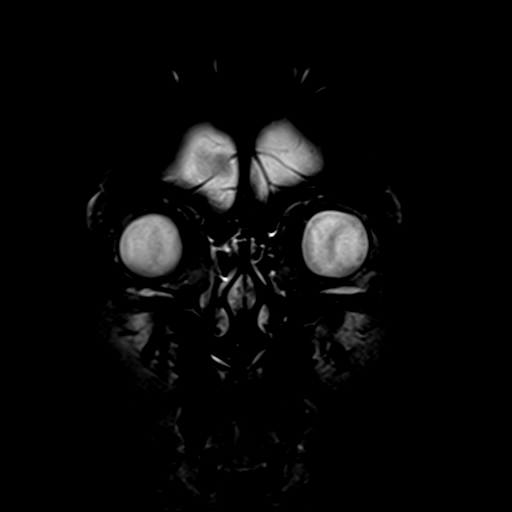

[Series 11: t2_ax_fs_3mm · axial · 3.0mm · 0.35mm/px · z∈[-22,+29]mm · 2 of 18 slices shown]
[im 1/18]
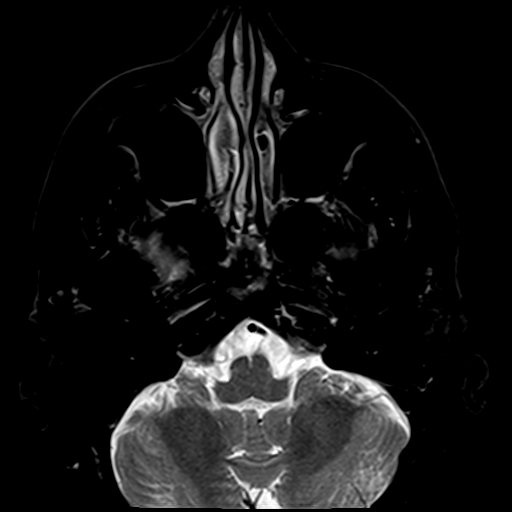
[im 18/18]
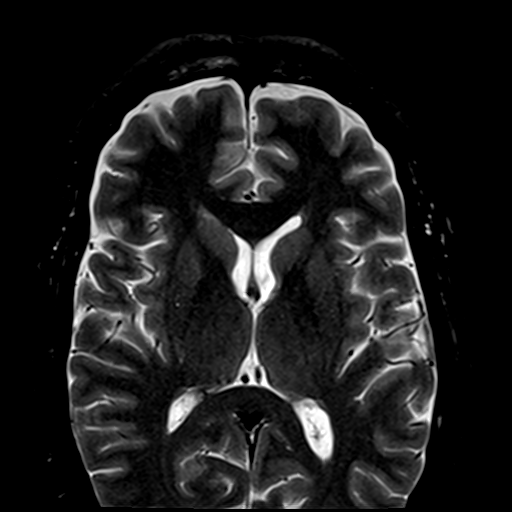

[Series 12: bSSFP · axial · 0.7mm · 0.28mm/px · 1 of 51 slices shown]
[im 1/51]
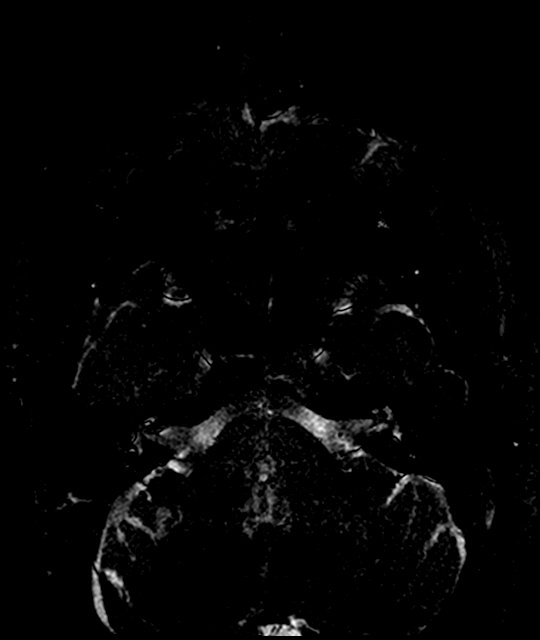

[37 of 48 positions shown; findings below may reference images not displayed]

FINDINGS: Brain: A 10 mm mass in the left olfactory fossa is unchanged in size
upon remeasurement. The mass remains intrinsically T1 hyperintense
(though not reflective of fat) which limits assessment for
enhancement, and the mass demonstrates heterogeneous T2 signal
intensity including a nodular hypointense focus inferiorly and
hyperintense component more superiorly. The mass is extra-axial and
exerts mild mass effect on the left gyrus rectus. There is no brain
edema or gliosis.

Elsewhere, the brain is normal in signal. There is no acute infarct,
intracranial hemorrhage, midline shift, or extra-axial fluid
collection. The ventricles are normal in size.

Vascular: Major intracranial vascular flow voids are preserved.

Skull and upper cervical spine: Unremarkable bone marrow signal.

Sinuses/Orbits: Unremarkable orbits. Small left mastoid effusion.
Clear paranasal sinuses.

Other: None.
IMPRESSION: 1. Unchanged 10 mm left olfactory fossa mass. This remains
indeterminate for a complex cyst (such as an extraventricular
colloid cyst) or small neoplasm. Follow-up brain MRI is recommended
in 6 months to assess stability.
2. Otherwise unremarkable appearance of the brain.

## 2019-07-24 MED ORDER — GADOBENATE DIMEGLUMINE 529 MG/ML IV SOLN
20.0000 mL | Freq: Once | INTRAVENOUS | Status: AC | PRN
  Administered 2019-07-24: 10:00:00 20 mL via INTRAVENOUS

## 2019-07-28 DIAGNOSIS — R109 Unspecified abdominal pain: Secondary | ICD-10-CM | POA: Diagnosis not present

## 2019-07-28 DIAGNOSIS — R300 Dysuria: Secondary | ICD-10-CM | POA: Diagnosis not present

## 2019-07-28 DIAGNOSIS — N410 Acute prostatitis: Secondary | ICD-10-CM | POA: Diagnosis not present

## 2019-07-31 ENCOUNTER — Encounter (INDEPENDENT_AMBULATORY_CARE_PROVIDER_SITE_OTHER): Payer: Self-pay | Admitting: Ophthalmology

## 2019-07-31 ENCOUNTER — Ambulatory Visit (INDEPENDENT_AMBULATORY_CARE_PROVIDER_SITE_OTHER): Payer: BC Managed Care – PPO | Admitting: Ophthalmology

## 2019-07-31 ENCOUNTER — Other Ambulatory Visit: Payer: Self-pay

## 2019-07-31 DIAGNOSIS — H43812 Vitreous degeneration, left eye: Secondary | ICD-10-CM | POA: Diagnosis not present

## 2019-07-31 DIAGNOSIS — H33311 Horseshoe tear of retina without detachment, right eye: Secondary | ICD-10-CM | POA: Insufficient documentation

## 2019-07-31 DIAGNOSIS — Z09 Encounter for follow-up examination after completed treatment for conditions other than malignant neoplasm: Secondary | ICD-10-CM

## 2019-07-31 DIAGNOSIS — H43811 Vitreous degeneration, right eye: Secondary | ICD-10-CM

## 2019-07-31 NOTE — Progress Notes (Signed)
07/31/2019     CHIEF COMPLAINT Patient presents for No chief complaint on file.   HISTORY OF PRESENT ILLNESS: Darren Wilson is a 59 y.o. male who presents to the clinic today for:   HPI    4 week follow up - FP OU, Post-op  Laser retinopexy  OD Patient denies change in vision and overall has no complaints. Patient states that the floaters have improved.   Last edited by Darren Crape, MD on 07/31/2019  5:01 PM. (History)      Referring physician: Clovis Wilson, Darren Wilson.Darren Saucer, MD [address] [place],  [place] [address]  HISTORICAL INFORMATION:   Selected notes from the MEDICAL RECORD NUMBER       CURRENT MEDICATIONS: No current outpatient medications on file. (Ophthalmic Drugs)   No current facility-administered medications for this visit. (Ophthalmic Drugs)   Current Outpatient Medications (Other)  Medication Sig  . ALPRAZolam (XANAX) 0.25 MG tablet 1-2 tablets by mouth daily as needed (Patient not taking: Reported on 06/24/2019)  . clonazePAM (KLONOPIN) 0.5 MG tablet Take 0.5 mg by mouth daily as needed.  . finasteride (PROSCAR) 5 MG tablet Take 2.5 mg by mouth daily.   Marland Kitchen loratadine (CLARITIN) 10 MG tablet Take 10 mg by mouth daily as needed.   Marland Kitchen OVER THE COUNTER MEDICATION Place 1 drop into both eyes daily as needed (for dryness of eyes). [employer] brand dry eye drops  . pantoprazole (PROTONIX) 20 MG tablet Take 20 mg by mouth daily as needed.  . propranolol (INDERAL) 20 MG tablet As directed, as needed. (Patient not taking: Reported on 06/24/2019)  . propranolol (INDERAL) 20 MG tablet TAKE AS DIRECTED AS NEEDED (Patient taking differently: Take 20 mg by mouth daily as needed. )  . SUMAtriptan (IMITREX) 100 MG tablet TAKE 1 TABLET (100 MG TOTAL) BY MOUTH EVERY 2 (TWO) HOURS AS NEEDED. (Patient taking differently: Take 100 mg by mouth every 2 (two) hours as needed. )   No current facility-administered medications for this visit. (Other)      REVIEW OF  SYSTEMS:    ALLERGIES Allergies  Allergen Reactions  . Tetracyclines & Related Other (See Comments)    GI upset    PAST MEDICAL HISTORY Past Medical History:  Diagnosis Date  . Migraines    History reviewed. No pertinent surgical history.  FAMILY HISTORY Family History  Problem Relation Age of Onset  . Heart attack Father     SOCIAL HISTORY Social History   Tobacco Use  . Smoking status: Never Smoker  . Smokeless tobacco: Never Used  Substance Use Topics  . Alcohol use: No  . Drug use: No         OPHTHALMIC EXAM:  Base Eye Exam    Visual Acuity (Snellen - Linear)      Right Left   Dist [place] 20/20 20/20       Tonometry (Tonopen, 4:08 PM)      Right Left   Pressure 11 13       Neuro/Psych    Oriented x3: Yes   Mood/Affect: Normal       Dilation    Right eye: 1.0% Mydriacyl, 2.5% Phenylephrine @ 4:08 PM        Slit Lamp and Fundus Exam    External Exam      Right Left   External Normal Normal       Slit Lamp Exam      Right Left   Lids/Lashes Normal Normal  Conjunctiva/Sclera White and quiet White and quiet   Cornea Clear Clear   Anterior Chamber Deep and quiet Deep and quiet   Iris Round and reactive Round and reactive   Anterior Vitreous Normal Normal       Fundus Exam      Right Left   Posterior Vitreous Central vitreous floaters    Disc Normal    C/D Ratio 0.2    Macula Normal    Vessels Normal    Periphery GOOD RETINOPEXY BREAK AT 11           IMAGING AND PROCEDURES  Imaging and Procedures for 07/31/19  Color Fundus Photography Optos - OU - Both Eyes       Right Eye Progression has been stable. Disc findings include normal observations. Macula : normal observations. Vessels : normal observations.   Left Eye Progression has improved. Disc findings include normal observations. Macula : normal observations. Vessels : normal observations.   Notes OD, good retinopexy around tear superiorly 11:00.  Traction remains                 ASSESSMENT/PLAN:  @PROBAPNOTE @    ICD-10-CM   1. Horseshoe retinal tear of right eye  H33.311 Color Fundus Photography Optos - OU - Both Eyes  2. Posterior vitreous detachment of right eye  H43.811   3. Posterior vitreous detachment of left eye  H43.812     1.  2.  3.  Ophthalmic Meds Ordered this visit:  No orders of the defined types were placed in this encounter.      Return in about 2 months (around 09/30/2019) for dilate, OD.  Patient Instructions  The nature of retinal tears was discussed with the patient. Treatment options including laser retinopexy versus cryotherapy was discussed with the patient as well, as possible side effects including possible failure to prevent retinal detachment.  Other tears in other meridians or at the same site of repair could develop as the vitreous gel continues to contract and exert traction over time. All the patient's questions were answered. An informational note  was given to the patient.  OD doing well status post laser retinopexy with good laser reaction.  Anterior leaflet traction poses a mild concern and thus will follow-up in 2 months    Explained the diagnoses, plan, and follow up with the patient and they expressed understanding.  Patient expressed understanding of the importance of proper follow up care.   Darren Wilson M.D. Diseases & Surgery of the Retina and Vitreous [name] 07/31/19     Abbreviations: M myopia (nearsighted); A astigmatism; H hyperopia (farsighted); P presbyopia; Mrx spectacle prescription;  CTL contact lenses; OD right eye; OS left eye; OU both eyes  XT exotropia; ET esotropia; PEK punctate epithelial keratitis; PEE punctate epithelial erosions; DES dry eye syndrome; MGD meibomian gland dysfunction; ATs artificial tears; PFAT's preservative free artificial tears; [place] nuclear sclerotic cataract; PSC posterior subcapsular cataract; ERM epi-retinal membrane; PVD  posterior vitreous detachment; RD retinal detachment; DM diabetes mellitus; DR diabetic retinopathy; NPDR non-proliferative diabetic retinopathy; PDR proliferative diabetic retinopathy; CSME clinically significant macular edema; DME diabetic macular edema; dbh dot blot hemorrhages; CWS cotton wool spot; POAG primary open angle glaucoma; C/D cup-to-disc ratio; HVF humphrey visual field; GVF goldmann visual field; OCT optical coherence tomography; IOP intraocular pressure; BRVO Branch retinal vein occlusion; CRVO central retinal vein occlusion; CRAO central retinal artery occlusion; BRAO branch retinal artery occlusion; RT retinal tear; SB scleral buckle; PPV pars  plana vitrectomy; VH Vitreous hemorrhage; PRP panretinal laser photocoagulation; IVK intravitreal kenalog; VMT vitreomacular traction; MH Macular hole;  NVD neovascularization of the disc; NVE neovascularization elsewhere; AREDS age related eye disease study; ARMD age related macular degeneration; POAG primary open angle glaucoma; EBMD epithelial/anterior basement membrane dystrophy; ACIOL anterior chamber intraocular lens; IOL intraocular lens; PCIOL posterior chamber intraocular lens; Phaco/IOL phacoemulsification with intraocular lens placement; [place] photorefractive keratectomy; LASIK laser assisted in situ keratomileusis; HTN hypertension; DM diabetes mellitus; COPD chronic obstructive pulmonary disease

## 2019-07-31 NOTE — Progress Notes (Signed)
07/31/2019     CHIEF COMPLAINT Patient presents for No chief complaint on file.   HISTORY OF PRESENT ILLNESS: Darren Wilson is a 59 y.o. male who presents to the clinic today for:   HPI    4 week follow up - FP OU, Post-op  Laser retinopexy  OD Patient denies change in vision and overall has no complaints. Patient states that the floaters have improved.   Last edited by Hurman Horn, MD on 07/31/2019  5:01 PM. (History)      Referring physician: Alroy Wilson, Carlean Jews.Marlou Sa, [place] [place],  [place] [address]  HISTORICAL INFORMATION:   Selected notes from the [facility]: No current outpatient medications on file. (Ophthalmic Drugs)   No current facility-administered medications for this visit. (Ophthalmic Drugs)   Current Outpatient Medications (Other)  Medication Sig  . ALPRAZolam (XANAX) 0.25 MG tablet 1-2 tablets by mouth daily as needed (Patient not taking: Reported on 06/24/2019)  . clonazePAM (KLONOPIN) 0.5 MG tablet Take 0.5 mg by mouth daily as needed.  . finasteride (PROSCAR) 5 MG tablet Take 2.5 mg by mouth daily.   Marland Kitchen loratadine (CLARITIN) 10 MG tablet Take 10 mg by mouth daily as needed.   Marland Kitchen OVER THE COUNTER MEDICATION Place 1 drop into both eyes daily as needed (for dryness of eyes). [employer] brand dry eye drops  . pantoprazole (PROTONIX) 20 MG tablet Take 20 mg by mouth daily as needed.  . propranolol (INDERAL) 20 MG tablet As directed, as needed. (Patient not taking: Reported on 06/24/2019)  . propranolol (INDERAL) 20 MG tablet TAKE AS DIRECTED AS NEEDED (Patient taking differently: Take 20 mg by mouth daily as needed. )  . SUMAtriptan (IMITREX) 100 MG tablet TAKE 1 TABLET (100 MG TOTAL) BY MOUTH EVERY 2 (TWO) HOURS AS NEEDED. (Patient taking differently: Take 100 mg by mouth every 2 (two) hours as needed. )   No current facility-administered medications for this visit. (Other)      REVIEW OF  SYSTEMS:    ALLERGIES Allergies  Allergen Reactions  . Tetracyclines & Related Other (See Comments)    GI upset    PAST MEDICAL HISTORY Past Medical History:  Diagnosis Date  . Migraines    History reviewed. No pertinent surgical history.  FAMILY HISTORY Family History  Problem Relation Age of Onset  . Heart attack Father     SOCIAL HISTORY Social History   Tobacco Use  . Smoking status: Never Smoker  . Smokeless tobacco: Never Used  Substance Use Topics  . Alcohol use: No  . Drug use: No         OPHTHALMIC EXAM: Base Eye Exam    Visual Acuity (Snellen - Linear)      Right Left   Dist [place] 20/20 20/20       Tonometry (Tonopen, 4:08 PM)      Right Left   Pressure 11 13       Neuro/Psych    Oriented x3: Yes   Mood/Affect: Normal       Dilation    Right eye: 1.0% Mydriacyl, 2.5% Phenylephrine @ 4:08 PM        Slit Lamp and Fundus Exam    External Exam      Right Left   External Normal Normal       Slit Lamp Exam      Right Left   Lids/Lashes Normal Normal  Conjunctiva/Sclera White and quiet White and quiet   Cornea Clear Clear   Anterior Chamber Deep and quiet Deep and quiet   Iris Round and reactive Round and reactive   Anterior Vitreous Normal Normal       Fundus Exam      Right Left   Posterior Vitreous Central vitreous floaters    Disc Normal    C/D Ratio 0.2    Macula Normal    Vessels Normal    Periphery GOOD RETINOPEXY BREAK AT 11           IMAGING AND PROCEDURES  Imaging and Procedures for 07/31/19           ASSESSMENT/PLAN:  @PROBAPNOTE @    ICD-10-CM   1. Horseshoe retinal tear of right eye  H33.311   2. Posterior vitreous detachment of right eye  H43.811   3. Posterior vitreous detachment of left eye  H43.812     1.  2.  3.  Ophthalmic Meds Ordered this visit:  No orders of the defined types were placed in this encounter.      Return in about 2 months (around 09/30/2019) for dilate,  OD.  Patient Instructions  The nature of retinal tears was discussed with the patient. Treatment options including laser retinopexy versus cryotherapy was discussed with the patient as well, as possible side effects including possible failure to prevent retinal detachment.  Other tears in other meridians or at the same site of repair could develop as the vitreous gel continues to contract and exert traction over time. All the patient's questions were answered. An informational note  was given to the patient.      Explained the diagnoses, plan, and follow up with the patient and they expressed understanding.  Patient expressed understanding of the importance of proper follow up care.   11/30/2019 [name] M.D. Diseases & Surgery of the Retina and Vitreous [facility] 07/31/19     Abbreviations: M myopia (nearsighted); A astigmatism; H hyperopia (farsighted); P presbyopia; Mrx spectacle prescription;  CTL contact lenses; OD right eye; OS left eye; OU both eyes  XT exotropia; ET esotropia; PEK punctate epithelial keratitis; PEE punctate epithelial erosions; DES dry eye syndrome; MGD meibomian gland dysfunction; ATs artificial tears; PFAT's preservative free artificial tears; NSC nuclear sclerotic cataract; PSC posterior subcapsular cataract; ERM epi-retinal membrane; PVD posterior vitreous detachment; RD retinal detachment; DM diabetes mellitus; DR diabetic retinopathy; NPDR non-proliferative diabetic retinopathy; PDR proliferative diabetic retinopathy; CSME clinically significant macular edema; DME diabetic macular edema; dbh dot blot hemorrhages; CWS cotton wool spot; POAG primary open angle glaucoma; C/D cup-to-disc ratio; HVF humphrey visual field; GVF goldmann visual field; OCT optical coherence tomography; IOP intraocular pressure; BRVO Branch retinal vein occlusion; CRVO central retinal vein occlusion; CRAO central retinal artery occlusion; BRAO branch retinal artery occlusion; RT  retinal tear; SB scleral buckle; PPV pars plana vitrectomy; VH Vitreous hemorrhage; PRP panretinal laser photocoagulation; IVK intravitreal kenalog; VMT vitreomacular traction; MH Macular hole;  NVD neovascularization of the disc; NVE neovascularization elsewhere; AREDS age related eye disease study; ARMD age related macular degeneration; POAG primary open angle glaucoma; EBMD epithelial/anterior basement membrane dystrophy; ACIOL anterior chamber intraocular lens; IOL intraocular lens; PCIOL posterior chamber intraocular lens; Phaco/IOL phacoemulsification with intraocular lens placement; PRK photorefractive keratectomy; LASIK laser assisted in situ keratomileusis; HTN hypertension; DM diabetes mellitus; COPD chronic obstructive pulmonary disease

## 2019-07-31 NOTE — Patient Instructions (Addendum)
The nature of retinal tears was discussed with the patient. Treatment options including laser retinopexy versus cryotherapy was discussed with the patient as well, as possible side effects including possible failure to prevent retinal detachment.  Other tears in other meridians or at the same site of repair could develop as the vitreous gel continues to contract and exert traction over time. All the patient's questions were answered. An informational note  was given to the patient.  OD doing well status post laser retinopexy with good laser reaction.  Anterior leaflet traction poses a mild concern and thus will follow-up in 2 months

## 2019-08-03 ENCOUNTER — Encounter (INDEPENDENT_AMBULATORY_CARE_PROVIDER_SITE_OTHER): Payer: BC Managed Care – PPO | Admitting: Ophthalmology

## 2019-10-02 ENCOUNTER — Encounter (INDEPENDENT_AMBULATORY_CARE_PROVIDER_SITE_OTHER): Payer: Self-pay | Admitting: Ophthalmology

## 2019-10-02 ENCOUNTER — Other Ambulatory Visit: Payer: Self-pay

## 2019-10-02 ENCOUNTER — Ambulatory Visit (INDEPENDENT_AMBULATORY_CARE_PROVIDER_SITE_OTHER): Payer: BC Managed Care – PPO | Admitting: Ophthalmology

## 2019-10-02 DIAGNOSIS — H43813 Vitreous degeneration, bilateral: Secondary | ICD-10-CM

## 2019-10-02 DIAGNOSIS — H33311 Horseshoe tear of retina without detachment, right eye: Secondary | ICD-10-CM

## 2019-10-02 NOTE — Progress Notes (Signed)
10/02/2019     CHIEF COMPLAINT Patient presents for Retina Follow Up   HISTORY OF PRESENT ILLNESS: Darren Wilson is a 59 y.o. male who presents to the clinic today for:   HPI    Retina Follow Up    Patient presents with  Retinal Break/Detachment.  In both eyes.  Duration of 2 months.  Since onset it is gradually worsening.          Comments    2 month follow up - OCT OU Patient states that a week ago, he started noticing very dark floaters. Patient states that they have become opaque and look like blurry spots now.       Last edited by Berenice Bouton on 10/02/2019  2:53 PM. (History)      Referring physician: [name], L.[name], MD [address] [place],  [place] [address]  HISTORICAL INFORMATION:   Selected notes from the MEDICAL RECORD NUMBER       CURRENT MEDICATIONS: No current outpatient medications on file. (Ophthalmic Drugs)   No current facility-administered medications for this visit. (Ophthalmic Drugs)   Current Outpatient Medications (Other)  Medication Sig  . ALPRAZolam (XANAX) 0.25 MG tablet 1-2 tablets by mouth daily as needed (Patient not taking: Reported on 06/24/2019)  . clonazePAM (KLONOPIN) 0.5 MG tablet Take 0.5 mg by mouth daily as needed.  . finasteride (PROSCAR) 5 MG tablet Take 2.5 mg by mouth daily.   Marland Kitchen loratadine (CLARITIN) 10 MG tablet Take 10 mg by mouth daily as needed.   Marland Kitchen OVER THE COUNTER MEDICATION Place 1 drop into both eyes daily as needed (for dryness of eyes). [employer] brand dry eye drops  . pantoprazole (PROTONIX) 20 MG tablet Take 20 mg by mouth daily as needed.  . propranolol (INDERAL) 20 MG tablet As directed, as needed. (Patient not taking: Reported on 06/24/2019)  . propranolol (INDERAL) 20 MG tablet TAKE AS DIRECTED AS NEEDED (Patient taking differently: Take 20 mg by mouth daily as needed. )  . SUMAtriptan (IMITREX) 100 MG tablet TAKE 1 TABLET (100 MG TOTAL) BY MOUTH EVERY 2 (TWO) HOURS AS NEEDED. (Patient taking  differently: Take 100 mg by mouth every 2 (two) hours as needed. )   No current facility-administered medications for this visit. (Other)      REVIEW OF SYSTEMS:    ALLERGIES Allergies  Allergen Reactions  . Tetracyclines & Related Other (See Comments)    GI upset    PAST MEDICAL HISTORY Past Medical History:  Diagnosis Date  . Migraines    History reviewed. No pertinent surgical history.  FAMILY HISTORY Family History  Problem Relation Age of Onset  . Heart attack Father     SOCIAL HISTORY Social History   Tobacco Use  . Smoking status: Never Smoker  . Smokeless tobacco: Never Used  Substance Use Topics  . Alcohol use: No  . Drug use: No         OPHTHALMIC EXAM:  Base Eye Exam    Visual Acuity (Snellen - Linear)      Right Left   Dist [place] 20/20 20/20       Tonometry (Tonopen, 2:55 PM)      Right Left   Pressure 15 18       Visual Fields (Counting fingers)      Left Right    Full Full       Neuro/Psych    Oriented x3: Yes   Mood/Affect: Normal  Dilation    Right eye: 1.0% Mydriacyl, 2.5% Phenylephrine @ 2:55 PM        Slit Lamp and Fundus Exam    External Exam      Right Left   External Normal Normal       Slit Lamp Exam      Right Left   Lids/Lashes Normal Normal   Conjunctiva/Sclera White and quiet White and quiet   Cornea Clear Clear   Anterior Chamber Deep and quiet Deep and quiet   Iris Round and reactive Round and reactive   Anterior Vitreous Normal Normal       Fundus Exam      Right Left   Posterior Vitreous Central vitreous floaters    Disc Normal    C/D Ratio 0.2    Macula Normal    Vessels Normal    Periphery GOOD RETINOPEXY BREAK AT 11, with some encroachment into the laser demarcation zone on the superior edge.  It is best seen with scleral depression.           IMAGING AND PROCEDURES  Imaging and Procedures for 10/06/19  OCT, Retina - OU - Both Eyes       Right Eye Quality was good. Scan  locations included subfoveal. Progression has no prior data. Findings include normal foveal contour.   Left Eye Quality was good. Scan locations included subfoveal. Progression has no prior data. Findings include normal foveal contour.   Notes Posterior vitreous detachment OU       Repair Retinal Breaks, Laser - OD - Right Eye       Tear locations include superior.   Time Out Confirmed correct patient, procedure, site, and patient consented.   Anesthesia Topical anesthesia was used. Anesthetic medications included Proparacaine 0.5%.   Laser Information The type of laser was diode. Color was yellow. The duration in seconds was 0.02. The spot size was 390 microns. Laser power was 340. Total spots was 270.   Post-op The patient tolerated the procedure well. There were no complications. The patient received written and verbal post procedure care education.   Notes Laser retinopexy extended and repeated around the superotemporal tear at the 11:00                ASSESSMENT/PLAN:  Horseshoe retinal tear of right eye History of laser retinopexy OD for retinal break at 11:00 meridian, with possibly some extension beyond the demarcation of laser zone.  Additional laser retinopexy be applied today      ICD-10-CM   1. Horseshoe retinal tear of right eye  H33.311 Repair Retinal Breaks, Laser - OD - Right Eye  2. Posterior vitreous detachment of both eyes  H43.813 OCT, Retina - OU - Both Eyes    1. Laser retinopexy completed OD.  2.  3.  Ophthalmic Meds Ordered this visit:  No orders of the defined types were placed in this encounter.      Return in about 3 weeks (around 10/23/2019) for dilate, OD, COLOR FP.  There are no Patient Instructions on file for this visit.   Explained the diagnoses, plan, and follow up with the patient and they expressed understanding.  Patient expressed understanding of the importance of proper follow up care.   Alford Highland Danzell Birky  M.D. Diseases & Surgery of the Retina and Vitreous [facility] 10/06/19     Abbreviations: M myopia (nearsighted); A astigmatism; H hyperopia (farsighted); P presbyopia; Mrx spectacle prescription;  CTL contact lenses; OD right  eye; OS left eye; OU both eyes  XT exotropia; ET esotropia; PEK punctate epithelial keratitis; PEE punctate epithelial erosions; DES dry eye syndrome; MGD meibomian gland dysfunction; ATs artificial tears; PFAT's preservative free artificial tears; [place] nuclear sclerotic cataract; PSC posterior subcapsular cataract; ERM epi-retinal membrane; PVD posterior vitreous detachment; RD retinal detachment; DM diabetes mellitus; DR diabetic retinopathy; NPDR non-proliferative diabetic retinopathy; PDR proliferative diabetic retinopathy; CSME clinically significant macular edema; DME diabetic macular edema; dbh dot blot hemorrhages; CWS cotton wool spot; POAG primary open angle glaucoma; C/D cup-to-disc ratio; HVF humphrey visual field; GVF goldmann visual field; OCT optical coherence tomography; IOP intraocular pressure; BRVO Branch retinal vein occlusion; CRVO central retinal vein occlusion; CRAO central retinal artery occlusion; BRAO branch retinal artery occlusion; RT retinal tear; SB scleral buckle; PPV pars plana vitrectomy; VH Vitreous hemorrhage; PRP panretinal laser photocoagulation; IVK intravitreal kenalog; VMT vitreomacular traction; MH Macular hole;  NVD neovascularization of the disc; NVE neovascularization elsewhere; AREDS age related eye disease study; ARMD age related macular degeneration; POAG primary open angle glaucoma; EBMD epithelial/anterior basement membrane dystrophy; ACIOL anterior chamber intraocular lens; IOL intraocular lens; PCIOL posterior chamber intraocular lens; Phaco/IOL phacoemulsification with intraocular lens placement; [place] photorefractive keratectomy; LASIK laser assisted in situ keratomileusis; HTN hypertension; DM diabetes mellitus; COPD  chronic obstructive pulmonary disease

## 2019-10-02 NOTE — Assessment & Plan Note (Signed)
History of laser retinopexy OD for retinal break at 11:00 meridian, with possibly some extension beyond the demarcation of laser zone.  Additional laser retinopexy be applied today

## 2019-10-26 ENCOUNTER — Ambulatory Visit (INDEPENDENT_AMBULATORY_CARE_PROVIDER_SITE_OTHER): Payer: BC Managed Care – PPO | Admitting: Ophthalmology

## 2019-10-26 ENCOUNTER — Other Ambulatory Visit: Payer: Self-pay

## 2019-10-26 ENCOUNTER — Encounter (INDEPENDENT_AMBULATORY_CARE_PROVIDER_SITE_OTHER): Payer: Self-pay | Admitting: Ophthalmology

## 2019-10-26 DIAGNOSIS — Z09 Encounter for follow-up examination after completed treatment for conditions other than malignant neoplasm: Secondary | ICD-10-CM

## 2019-10-26 DIAGNOSIS — H33311 Horseshoe tear of retina without detachment, right eye: Secondary | ICD-10-CM | POA: Diagnosis not present

## 2019-10-26 NOTE — Progress Notes (Signed)
10/26/2019     CHIEF COMPLAINT Patient presents for Retina Follow Up   HISTORY OF PRESENT ILLNESS: Darren Wilson is a 59 y.o. male who presents to the clinic today for:   HPI    Retina Follow Up    Patient presents with  Other.  In right eye.  Duration of 3 weeks.  Since onset it is stable.          Comments    3 week follow up - FP OU Pt states that his eye is improving since last visit. Pt states that he still has floaters that are small black spots but has another that seems liek a blurry spot.       Last edited by Berenice Bouton on 10/26/2019  2:49 PM. (History)      Referring physician: [name], L.[name], MD [address] [place],  [place] [address]  HISTORICAL INFORMATION:   Selected notes from the MEDICAL RECORD NUMBER       CURRENT MEDICATIONS: No current outpatient medications on file. (Ophthalmic Drugs)   No current facility-administered medications for this visit. (Ophthalmic Drugs)   Current Outpatient Medications (Other)  Medication Sig  . ALPRAZolam (XANAX) 0.25 MG tablet 1-2 tablets by mouth daily as needed (Patient not taking: Reported on 06/24/2019)  . clonazePAM (KLONOPIN) 0.5 MG tablet Take 0.5 mg by mouth daily as needed.  . finasteride (PROSCAR) 5 MG tablet Take 2.5 mg by mouth daily.   Marland Kitchen loratadine (CLARITIN) 10 MG tablet Take 10 mg by mouth daily as needed.   Marland Kitchen OVER THE COUNTER MEDICATION Place 1 drop into both eyes daily as needed (for dryness of eyes). [employer] brand dry eye drops  . pantoprazole (PROTONIX) 20 MG tablet Take 20 mg by mouth daily as needed.  . propranolol (INDERAL) 20 MG tablet As directed, as needed. (Patient not taking: Reported on 06/24/2019)  . propranolol (INDERAL) 20 MG tablet TAKE AS DIRECTED AS NEEDED (Patient taking differently: Take 20 mg by mouth daily as needed. )  . SUMAtriptan (IMITREX) 100 MG tablet TAKE 1 TABLET (100 MG TOTAL) BY MOUTH EVERY 2 (TWO) HOURS AS NEEDED. (Patient taking differently: Take 100  mg by mouth every 2 (two) hours as needed. )   No current facility-administered medications for this visit. (Other)      REVIEW OF SYSTEMS:    ALLERGIES Allergies  Allergen Reactions  . Tetracyclines & Related Other (See Comments)    GI upset    PAST MEDICAL HISTORY Past Medical History:  Diagnosis Date  . Migraines    History reviewed. No pertinent surgical history.  FAMILY HISTORY Family History  Problem Relation Age of Onset  . Heart attack Father     SOCIAL HISTORY Social History   Tobacco Use  . Smoking status: Never Smoker  . Smokeless tobacco: Never Used  Substance Use Topics  . Alcohol use: No  . Drug use: No         OPHTHALMIC EXAM:  Base Eye Exam    Visual Acuity (Snellen - Linear)      Right Left   Dist [place] 20/20 20/20       Tonometry (Tonopen, 2:51 PM)      Right Left   Pressure 6 6       Pupils      Pupils Dark Light Shape React APD   Right PERRL 6 4 Round Brisk None   Left PERRL 6 4 Round Brisk None  Visual Fields (Counting fingers)      Left Right    Full Full       Extraocular Movement      Right Left    Full Full       Neuro/Psych    Oriented x3: Yes   Mood/Affect: Normal       Dilation    Right eye: 1.0% Mydriacyl, 2.5% Phenylephrine @ 2:51 PM        Slit Lamp and Fundus Exam    External Exam      Right Left   External Normal Normal       Slit Lamp Exam      Right Left   Lids/Lashes Normal Normal   Conjunctiva/Sclera White and quiet White and quiet   Cornea Clear Clear   Anterior Chamber Deep and quiet Deep and quiet   Iris Round and reactive Round and reactive   Anterior Vitreous Normal Normal       Fundus Exam      Right Left   Posterior Vitreous Central vitreous floaters    Disc Normal    C/D Ratio 0.2    Macula Normal    Vessels Normal    Periphery GOOD RETINOPEXY BREAK AT 11, with some encroachment into the laser demarcation zone on the superior edge, now completely surrounded by  further retinopexy            IMAGING AND PROCEDURES  Imaging and Procedures for 10/26/19  Color Fundus Photography Optos - OU - Both Eyes       Right Eye Progression has been stable. Disc findings include normal observations. Macula : normal observations. Vessels : normal observations. Periphery : normal observations.   Left Eye Progression has been stable. Disc findings include normal observations. Macula : normal observations. Periphery : normal observations.   Notes OD laser retinopexy visualized superotemporally.  No other retinal holes or tears, medial clear, PVD.                ASSESSMENT/PLAN:  Horseshoe retinal tear of right eye Good retinopexy around superotemporal break right eye, no new retinal breaks right eye, will observe      ICD-10-CM   1. Horseshoe retinal tear of right eye  H33.311 Color Fundus Photography Optos - OU - Both Eyes    1.  2.  3.  Ophthalmic Meds Ordered this visit:  No orders of the defined types were placed in this encounter.      Return in about 6 months (around 04/27/2020) for DILATE OU, COLOR FP.  There are no Patient Instructions on file for this visit.   Explained the diagnoses, plan, and follow up with the patient and they expressed understanding.  Patient expressed understanding of the importance of proper follow up care.   Alford Highland Laquinn Shippy M.D. Diseases & Surgery of the Retina and Vitreous [facility] 10/26/19     Abbreviations: M myopia (nearsighted); A astigmatism; H hyperopia (farsighted); P presbyopia; Mrx spectacle prescription;  CTL contact lenses; OD right eye; OS left eye; OU both eyes  XT exotropia; ET esotropia; PEK punctate epithelial keratitis; PEE punctate epithelial erosions; DES dry eye syndrome; MGD meibomian gland dysfunction; ATs artificial tears; PFAT's preservative free artificial tears; NSC nuclear sclerotic cataract; PSC posterior subcapsular cataract; ERM epi-retinal  membrane; PVD posterior vitreous detachment; RD retinal detachment; DM diabetes mellitus; DR diabetic retinopathy; NPDR non-proliferative diabetic retinopathy; PDR proliferative diabetic retinopathy; CSME clinically significant macular edema; DME diabetic macular edema; dbh dot  blot hemorrhages; CWS cotton wool spot; POAG primary open angle glaucoma; C/D cup-to-disc ratio; HVF humphrey visual field; GVF goldmann visual field; OCT optical coherence tomography; IOP intraocular pressure; BRVO Branch retinal vein occlusion; CRVO central retinal vein occlusion; CRAO central retinal artery occlusion; BRAO branch retinal artery occlusion; RT retinal tear; SB scleral buckle; PPV pars plana vitrectomy; VH Vitreous hemorrhage; PRP panretinal laser photocoagulation; IVK intravitreal kenalog; VMT vitreomacular traction; MH Macular hole;  NVD neovascularization of the disc; NVE neovascularization elsewhere; AREDS age related eye disease study; ARMD age related macular degeneration; POAG primary open angle glaucoma; EBMD epithelial/anterior basement membrane dystrophy; ACIOL anterior chamber intraocular lens; IOL intraocular lens; PCIOL posterior chamber intraocular lens; Phaco/IOL phacoemulsification with intraocular lens placement; [place] photorefractive keratectomy; LASIK laser assisted in situ keratomileusis; HTN hypertension; DM diabetes mellitus; COPD chronic obstructive pulmonary disease

## 2019-10-26 NOTE — Assessment & Plan Note (Signed)
Good retinopexy around superotemporal break right eye, no new retinal breaks right eye, will observe

## 2019-11-06 DIAGNOSIS — K648 Other hemorrhoids: Secondary | ICD-10-CM | POA: Diagnosis not present

## 2019-11-06 DIAGNOSIS — K5730 Diverticulosis of large intestine without perforation or abscess without bleeding: Secondary | ICD-10-CM | POA: Diagnosis not present

## 2019-11-06 DIAGNOSIS — Z1211 Encounter for screening for malignant neoplasm of colon: Secondary | ICD-10-CM | POA: Diagnosis not present

## 2019-11-06 DIAGNOSIS — D125 Benign neoplasm of sigmoid colon: Secondary | ICD-10-CM | POA: Diagnosis not present

## 2019-11-06 DIAGNOSIS — D120 Benign neoplasm of cecum: Secondary | ICD-10-CM | POA: Diagnosis not present

## 2020-01-17 ENCOUNTER — Other Ambulatory Visit: Payer: Self-pay | Admitting: Family Medicine

## 2020-01-17 DIAGNOSIS — R229 Localized swelling, mass and lump, unspecified: Secondary | ICD-10-CM

## 2020-02-08 ENCOUNTER — Other Ambulatory Visit: Payer: Self-pay

## 2020-02-08 ENCOUNTER — Ambulatory Visit
Admission: RE | Admit: 2020-02-08 | Discharge: 2020-02-08 | Disposition: A | Discharge status: Home / Self Care | Payer: BC Managed Care – PPO | Source: Ambulatory Visit | Attending: Family Medicine | Admitting: Family Medicine

## 2020-02-08 DIAGNOSIS — R220 Localized swelling, mass and lump, head: Secondary | ICD-10-CM | POA: Diagnosis not present

## 2020-02-08 DIAGNOSIS — R229 Localized swelling, mass and lump, unspecified: Secondary | ICD-10-CM

## 2020-02-08 DIAGNOSIS — G930 Cerebral cysts: Secondary | ICD-10-CM | POA: Diagnosis not present

## 2020-02-08 IMAGING — MR MR HEAD WO/W CM
13 series · 48 of 48 positions shown · IV contrast (20 MULTIHANCE)
Comparison: [DATE]

CLINICAL DATA: Follow-up brain mass

EXAM:
MRI HEAD WITHOUT AND WITH CONTRAST
TECHNIQUE: Multiplanar, multiecho pulse sequences of the brain and surrounding
structures were obtained without and with intravenous contrast.
CONTRAST:  20mL MULTIHANCE GADOBENATE DIMEGLUMINE 529 MG/ML IV SOLN

[Series 5: T1 · sagittal · 4.0mm · 0.75mm/px · 2 of 31 slices shown (1 of 3)]
[im 1/31]
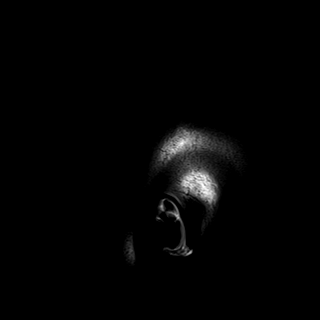
[im 31/31]
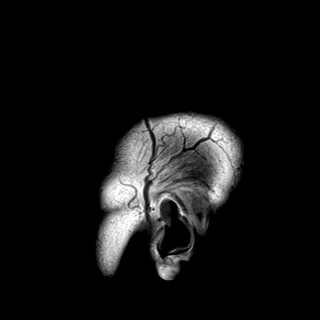

[Series 6: DWI · axial · 3.0mm · 1.50mm/px · z∈[-69,+79]mm · 5 of 92 slices shown (1 of 4)]
[im 1/92]
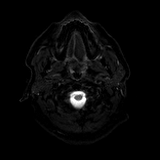
[im 23/92]
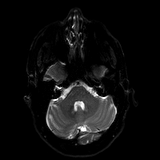
[im 46/92]
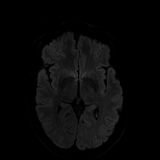
[im 69/92]
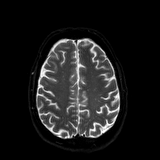
[im 92/92]
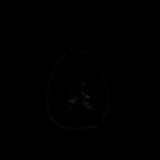

[Series 7: DWI · axial · 3.0mm · 1.50mm/px · z∈[-69,+79]mm · 3 of 46 slices shown (2 of 4)]
[im 1/46]
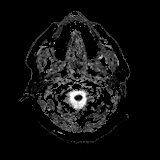
[im 23/46]
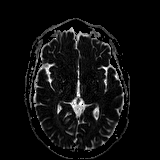
[im 46/46]
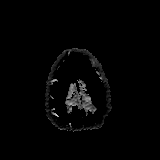

[Series 8: DWI · coronal · 5.0mm · 1.44mm/px · 4 of 68 slices shown (3 of 4)]
[im 1/68]
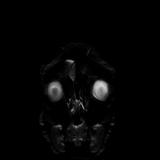
[im 23/68]
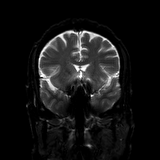
[im 45/68]
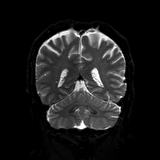
[im 68/68]
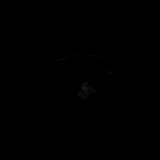

[Series 9: DWI · coronal · 5.0mm · 1.44mm/px · 2 of 34 slices shown (4 of 4)]
[im 1/34]
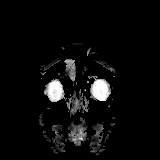
[im 34/34]
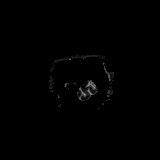

[Series 10: T2 · axial · 4.0mm · 0.36mm/px · z∈[-71,+80]mm · 2 of 30 slices shown]
[im 1/30]
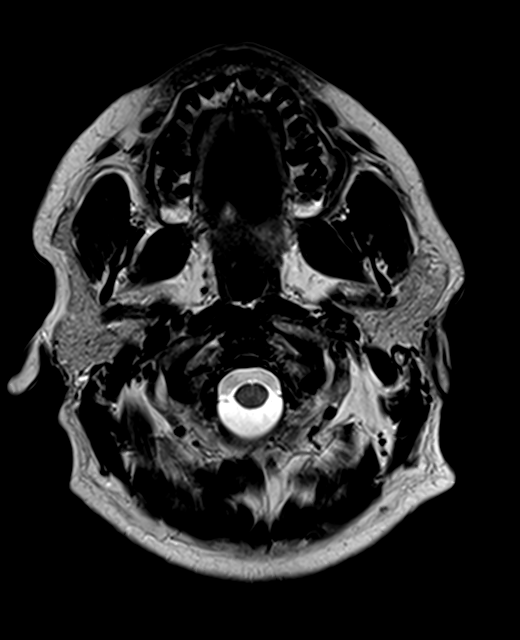
[im 30/30]
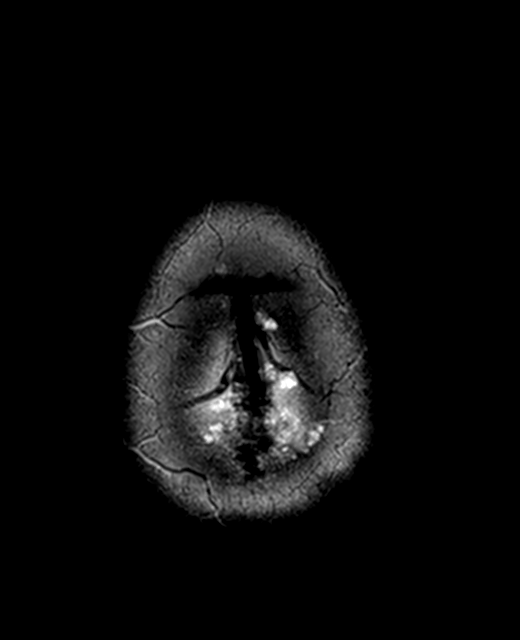

[Series 11: FLAIR · axial · 3.0mm · 0.72mm/px · 1 of 26 slices shown]
[im 1/26]
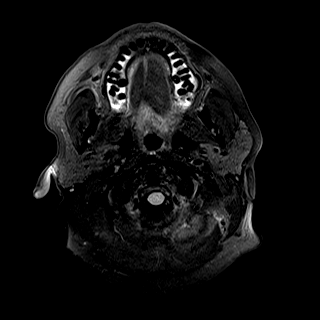

[Series 13: swi_images · axial · 1.5mm · 0.90mm/px · z∈[-66,+76]mm · 5 of 96 slices shown]
[im 1/96]
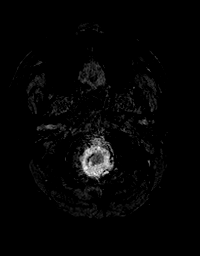
[im 24/96]
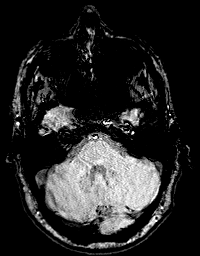
[im 48/96]
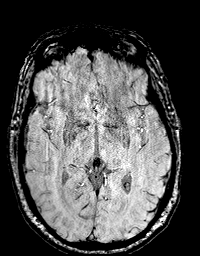
[im 72/96]
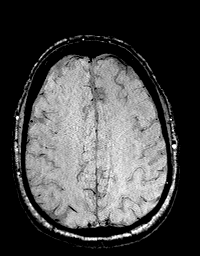
[im 96/96]
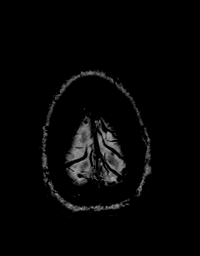

[Series 14: T1 · axial · 1.0mm · 0.94mm/px · z∈[-82,+77]mm · 9 of 160 slices shown (2 of 3)]
[im 1/160]
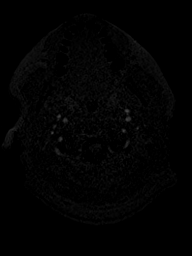
[im 20/160]
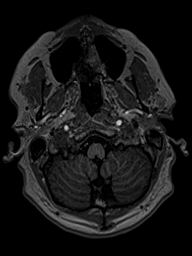
[im 40/160]
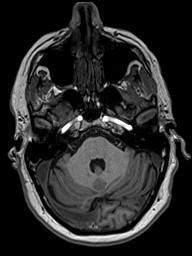
[im 60/160]
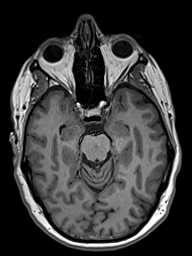
[im 80/160]
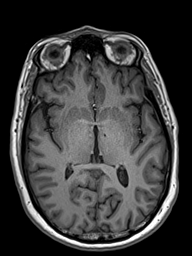
[im 100/160]
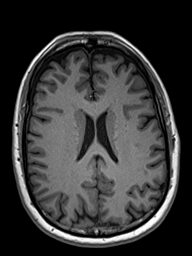
[im 120/160]
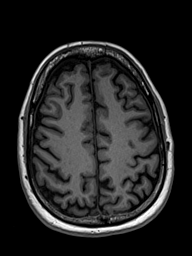
[im 140/160]
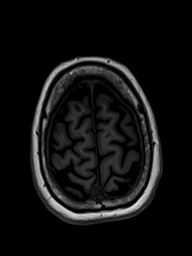
[im 160/160]
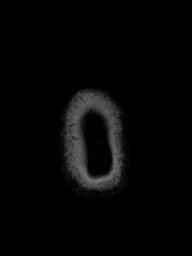

[Series 15: T2 post-contrast · coronal · 4.5mm · 0.36mm/px · 2 of 35 slices shown]
[im 1/35]
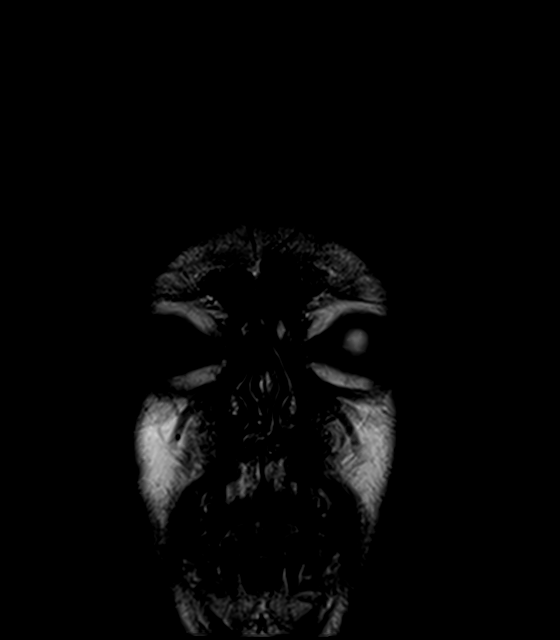
[im 35/35]
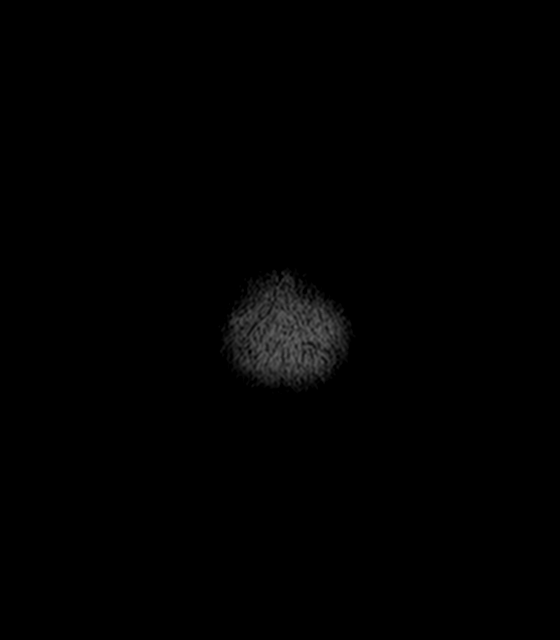

[Series 16: T1 · axial · 1.0mm · 0.94mm/px · z∈[-82,+77]mm · 9 of 160 slices shown (3 of 3)]
[im 1/160]
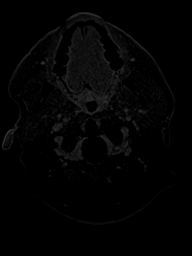
[im 20/160]
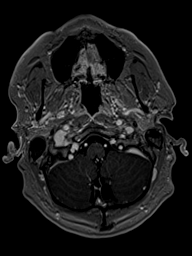
[im 40/160]
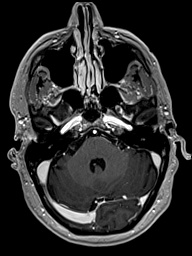
[im 60/160]
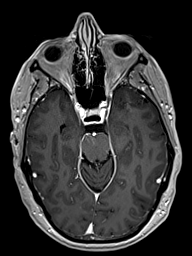
[im 80/160]
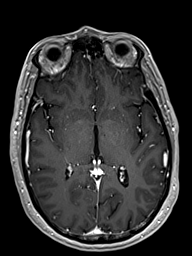
[im 100/160]
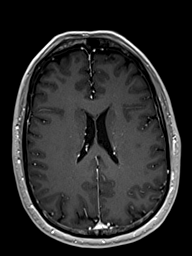
[im 120/160]
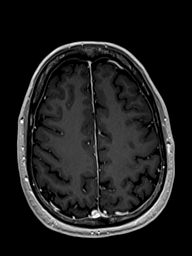
[im 140/160]
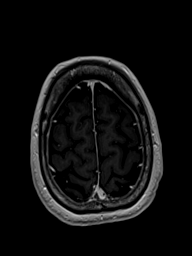
[im 160/160]
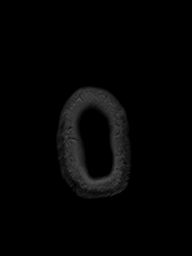

[Series 17: T1 post-contrast · coronal · 4.5mm · 0.72mm/px · 2 of 35 slices shown (1 of 2)]
[im 1/35]
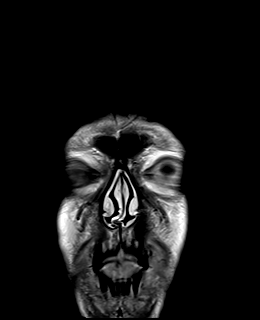
[im 35/35]
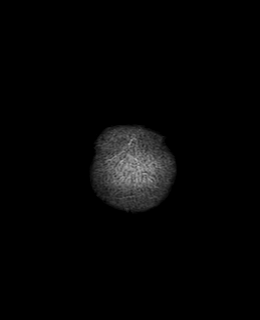

[Series 18: T1 post-contrast · sagittal · 4.0mm · 0.75mm/px · 2 of 31 slices shown (2 of 2)]
[im 1/31]
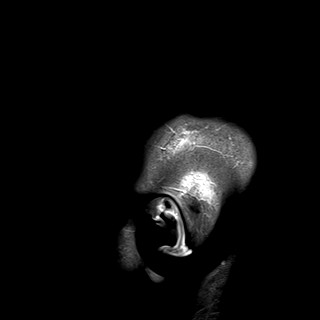
[im 31/31]
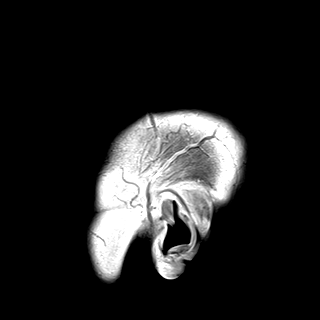

[48 of 48 positions shown; findings below may reference images not displayed]

FINDINGS: Brain: T1 hyperintense mass in the left olfactory groove measuring 1
cm on axial slices. The mass is unchanged in size. Although matching
signal of the crista galli, no ossification or fat by CT. No bony
dehiscence by CT either. When allowing for intrinsic signal no
detected enhancement, favor a proteinaceous cyst. No gliosis in the
adjacent brain on coronal T2 weighted imaging. No second lesion.
Exact underlying pathology is uncertain. Differential considerations
include melanotic meningioma, colloid cyst (which have been
described in this location with similar signal), or alternate
neoplasm (schwannomas have been described in this location and can
be intrinsically T1 hyperintense, although usually showing
perceptible enhancement).

The brain itself is normal with no infarct, hemorrhage,
hydrocephalus, atrophy, or white matter disease

Vascular: Normal flow voids and vascular enhancements.

Skull and upper cervical spine: Normal marrow signal.

Sinuses/Orbits: Clear
IMPRESSION: Size stable 1 cm mass in the left olfactory groove with differential
considerations noted above.

## 2020-02-08 MED ORDER — GADOBENATE DIMEGLUMINE 529 MG/ML IV SOLN
20.0000 mL | Freq: Once | INTRAVENOUS | Status: AC | PRN
  Administered 2020-02-08: 20 mL via INTRAVENOUS

## 2020-02-16 DIAGNOSIS — D496 Neoplasm of unspecified behavior of brain: Secondary | ICD-10-CM | POA: Diagnosis not present

## 2020-03-01 DIAGNOSIS — Z125 Encounter for screening for malignant neoplasm of prostate: Secondary | ICD-10-CM | POA: Diagnosis not present

## 2020-03-01 DIAGNOSIS — Z0000 Encounter for general adult medical examination without abnormal findings: Secondary | ICD-10-CM | POA: Diagnosis not present

## 2020-03-01 DIAGNOSIS — Z13220 Encounter for screening for lipoid disorders: Secondary | ICD-10-CM | POA: Diagnosis not present

## 2020-03-01 DIAGNOSIS — Z23 Encounter for immunization: Secondary | ICD-10-CM | POA: Diagnosis not present

## 2020-03-10 DIAGNOSIS — H15009 Unspecified scleritis, unspecified eye: Secondary | ICD-10-CM | POA: Diagnosis not present

## 2020-03-11 DIAGNOSIS — H2513 Age-related nuclear cataract, bilateral: Secondary | ICD-10-CM | POA: Diagnosis not present

## 2020-03-11 DIAGNOSIS — H15101 Unspecified episcleritis, right eye: Secondary | ICD-10-CM | POA: Diagnosis not present

## 2020-03-11 DIAGNOSIS — H43813 Vitreous degeneration, bilateral: Secondary | ICD-10-CM | POA: Diagnosis not present

## 2020-03-11 DIAGNOSIS — H33321 Round hole, right eye: Secondary | ICD-10-CM | POA: Diagnosis not present

## 2020-03-12 DIAGNOSIS — M542 Cervicalgia: Secondary | ICD-10-CM | POA: Diagnosis not present

## 2020-03-12 DIAGNOSIS — M5030 Other cervical disc degeneration, unspecified cervical region: Secondary | ICD-10-CM | POA: Diagnosis not present

## 2020-03-12 DIAGNOSIS — M25511 Pain in right shoulder: Secondary | ICD-10-CM | POA: Diagnosis not present

## 2020-03-21 DIAGNOSIS — H15111 Episcleritis periodica fugax, right eye: Secondary | ICD-10-CM | POA: Diagnosis not present

## 2020-04-09 DIAGNOSIS — H15009 Unspecified scleritis, unspecified eye: Secondary | ICD-10-CM | POA: Diagnosis not present

## 2020-04-09 DIAGNOSIS — M791 Myalgia, unspecified site: Secondary | ICD-10-CM | POA: Diagnosis not present

## 2020-04-11 ENCOUNTER — Ambulatory Visit (HOSPITAL_COMMUNITY)
Admission: RE | Admit: 2020-04-11 | Discharge: 2020-04-11 | Disposition: A | Discharge status: Home / Self Care | Payer: BC Managed Care – PPO | Source: Ambulatory Visit | Attending: Family Medicine | Admitting: Family Medicine

## 2020-04-11 ENCOUNTER — Other Ambulatory Visit: Payer: Self-pay

## 2020-04-11 ENCOUNTER — Other Ambulatory Visit: Payer: Self-pay | Admitting: Family Medicine

## 2020-04-11 DIAGNOSIS — R110 Nausea: Secondary | ICD-10-CM | POA: Insufficient documentation

## 2020-04-11 DIAGNOSIS — N281 Cyst of kidney, acquired: Secondary | ICD-10-CM | POA: Diagnosis not present

## 2020-04-11 DIAGNOSIS — R109 Unspecified abdominal pain: Secondary | ICD-10-CM | POA: Diagnosis not present

## 2020-04-11 IMAGING — US US ABDOMEN COMPLETE
2 series · 14 of 25 positions shown · non-contrast
Comparison: [DATE]

CLINICAL DATA: Intermittent abdominal pain.

EXAM:
ABDOMEN ULTRASOUND COMPLETE

[Series 1: us abdomen complete · 13 of 88 slices shown]
[im 1/88]
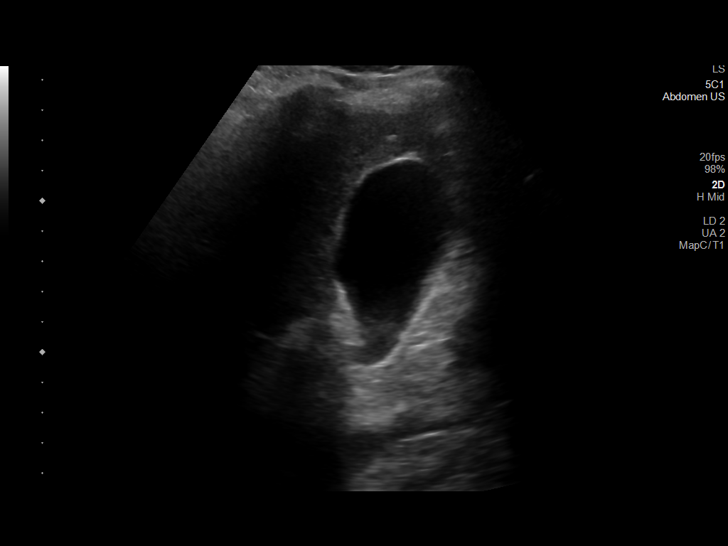
[im 8/88]
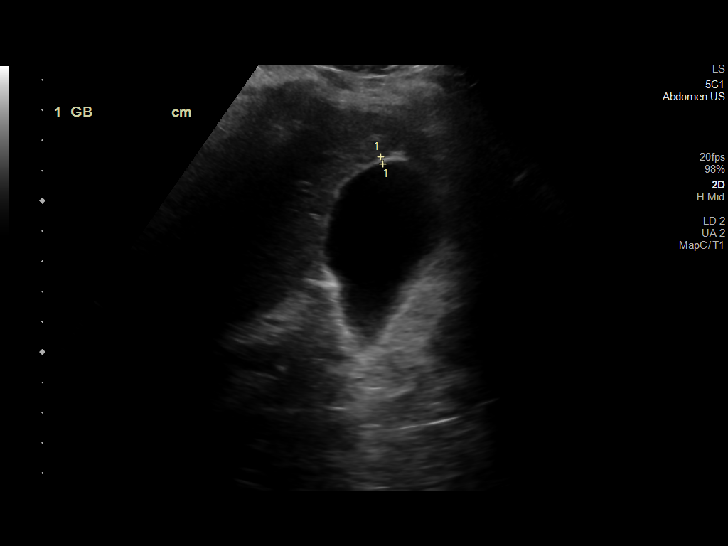
[im 16/88]
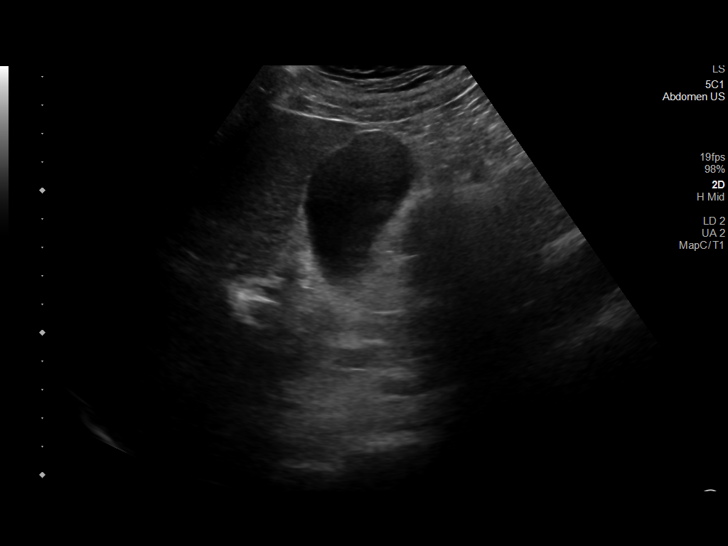
[im 23/88]
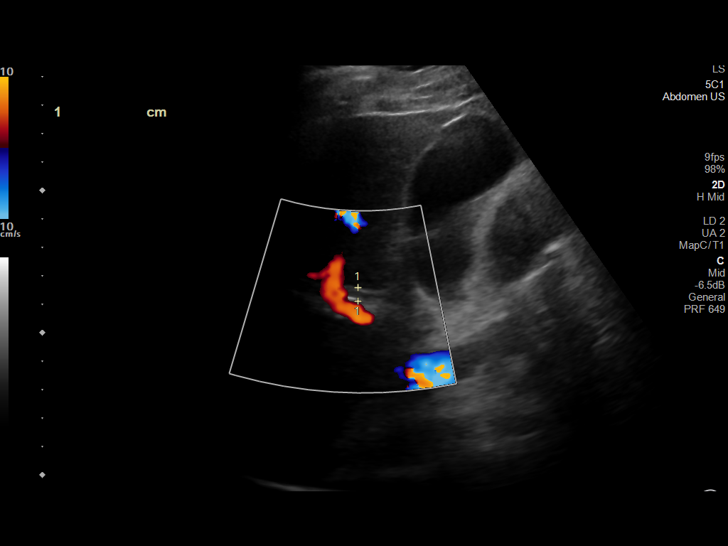
[im 31/88]
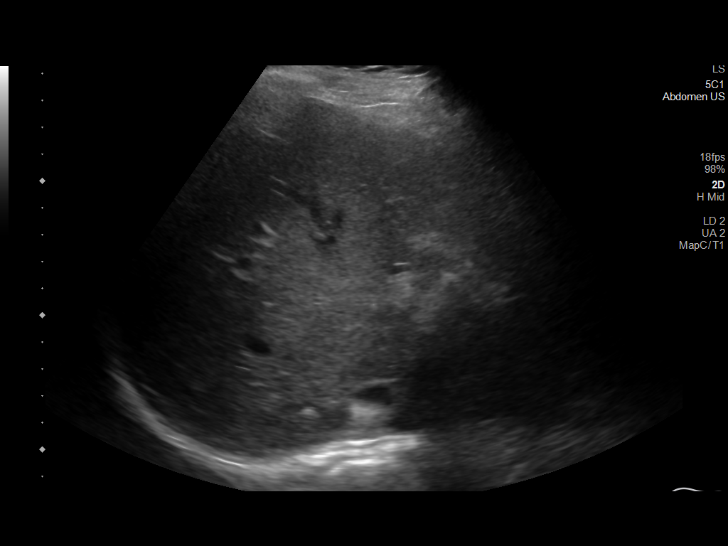
[im 35/88]
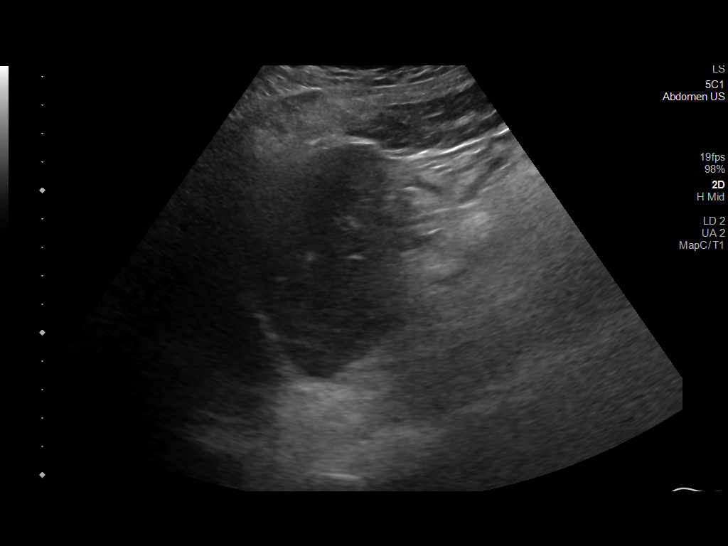
[im 42/88]
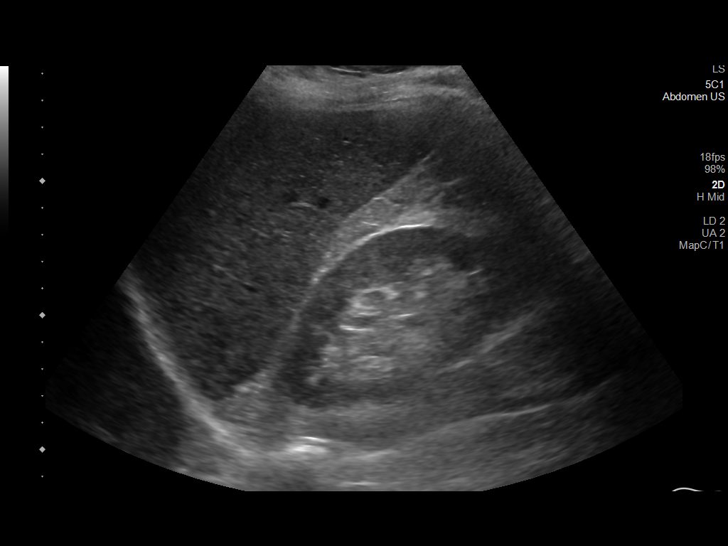
[im 50/88]
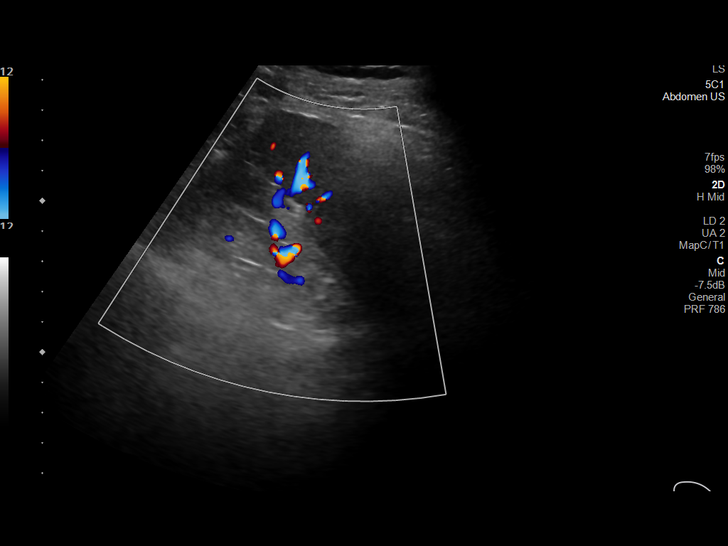
[im 57/88]
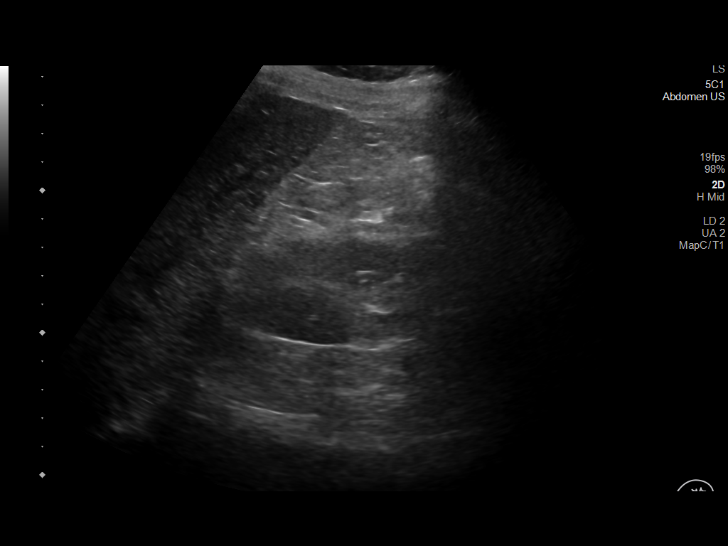
[im 61/88]
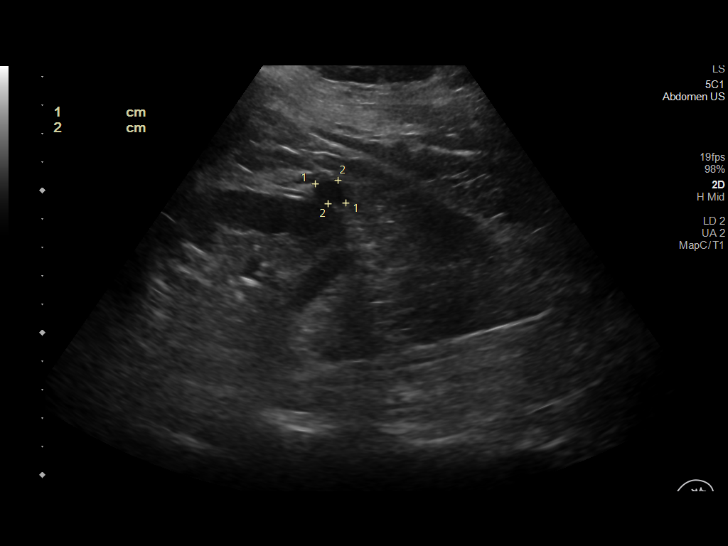
[im 69/88]
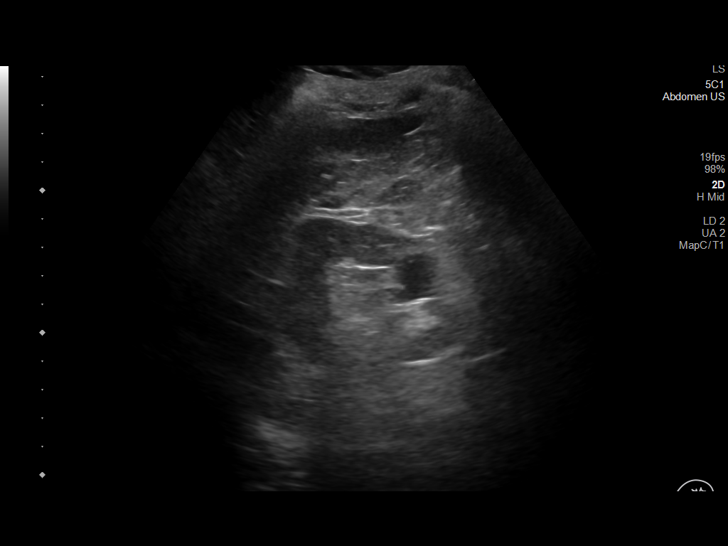
[im 76/88]
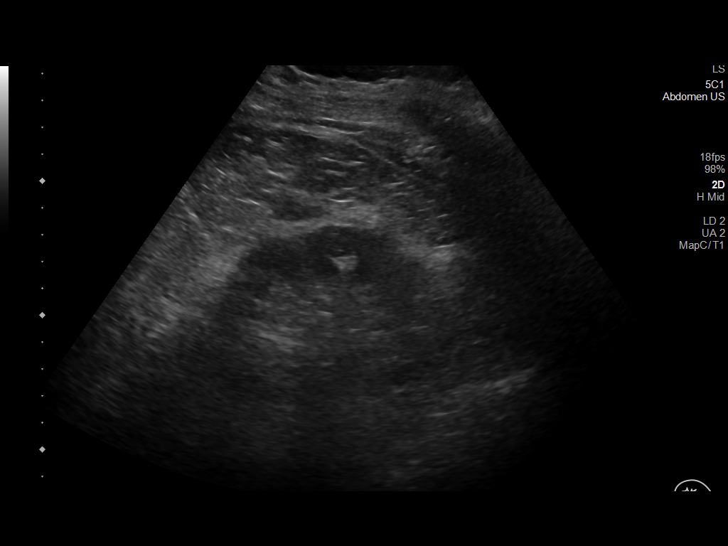
[im 84/88]
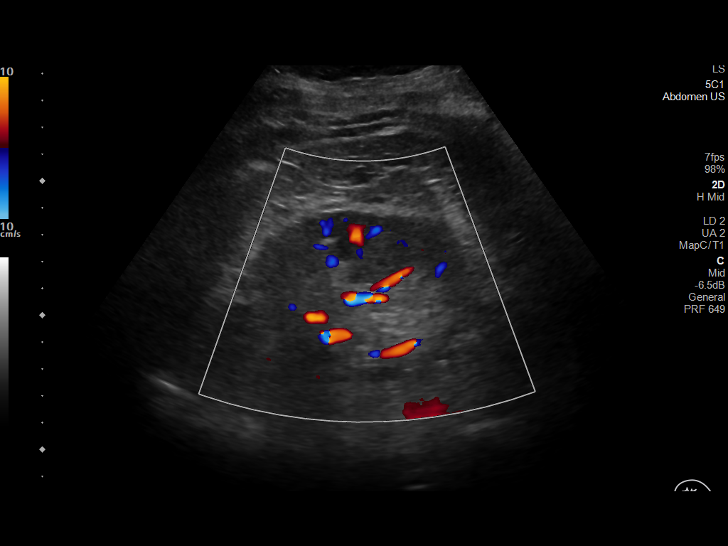

[Series 1001: abdomen us · 1 of 1 slices shown]
[im 1/1]
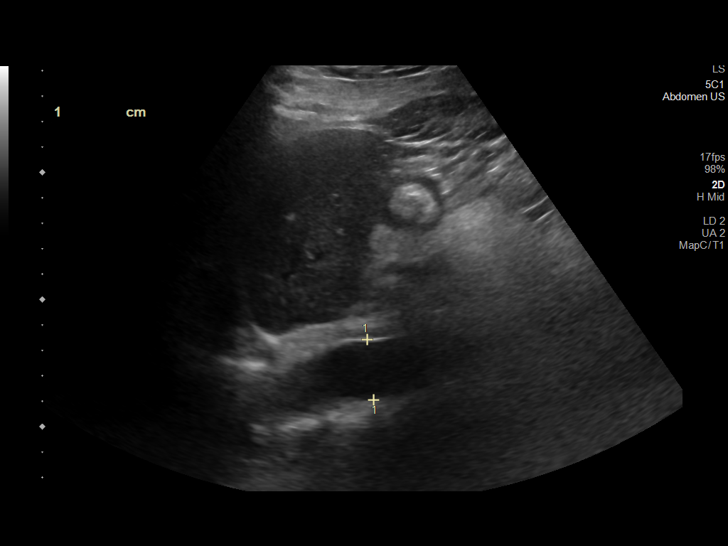

[14 of 25 positions shown; findings below may reference images not displayed]

FINDINGS: Gallbladder: No gallstones or wall thickening visualized. No
sonographic Murphy sign noted by sonographer.

Common bile duct: Diameter: Normal, 5 mm.

Liver: No focal lesion identified. Within normal limits in
parenchymal echogenicity. Portal vein is patent on color Doppler
imaging with normal direction of blood flow towards the liver.

IVC: No abnormality visualized.

Pancreas: Visualized portion unremarkable.

Spleen: Size and appearance within normal limits.

Right Kidney: Length: 12.0 cm. No hydronephrosis. Lower pole 2.6 cm
cyst or minimally complex cyst.

Left Kidney: Length: 12.1 cm. Echogenicity within normal limits. No
mass or hydronephrosis visualized.

Abdominal aorta: No aneurysm visualized.

Other findings: No ascites.
IMPRESSION: No acute process or explanation for abdominal pain.

## 2020-04-23 DIAGNOSIS — H35371 Puckering of macula, right eye: Secondary | ICD-10-CM | POA: Diagnosis not present

## 2020-04-23 DIAGNOSIS — H15003 Unspecified scleritis, bilateral: Secondary | ICD-10-CM | POA: Diagnosis not present

## 2020-04-23 DIAGNOSIS — H3091 Unspecified chorioretinal inflammation, right eye: Secondary | ICD-10-CM | POA: Diagnosis not present

## 2020-04-29 ENCOUNTER — Other Ambulatory Visit: Payer: Self-pay

## 2020-04-29 ENCOUNTER — Ambulatory Visit (INDEPENDENT_AMBULATORY_CARE_PROVIDER_SITE_OTHER): Payer: BC Managed Care – PPO | Admitting: Ophthalmology

## 2020-04-29 ENCOUNTER — Encounter (INDEPENDENT_AMBULATORY_CARE_PROVIDER_SITE_OTHER): Payer: Self-pay | Admitting: Ophthalmology

## 2020-04-29 DIAGNOSIS — H33311 Horseshoe tear of retina without detachment, right eye: Secondary | ICD-10-CM | POA: Diagnosis not present

## 2020-04-29 DIAGNOSIS — H15001 Unspecified scleritis, right eye: Secondary | ICD-10-CM

## 2020-04-29 DIAGNOSIS — H15003 Unspecified scleritis, bilateral: Secondary | ICD-10-CM | POA: Diagnosis not present

## 2020-04-29 NOTE — Assessment & Plan Note (Signed)
Good retinopexy, no new retinal breaks

## 2020-04-29 NOTE — Patient Instructions (Signed)
Patient to notify the office probably new onset visual symptoms flashes floaters discomfort

## 2020-04-29 NOTE — Progress Notes (Signed)
04/29/2020     CHIEF COMPLAINT Patient presents for Retina Follow Up (6 Month f\u OU. FP/Pt states vision has been stable. Pt being treated for scleritis on 40 mg Prednisone, currently 1 week today. Also using Eysuvis OU BID, since mid Nov.)   HISTORY OF PRESENT ILLNESS: Darren Wilson is a 60 y.o. male who presents to the clinic today for:   HPI    Retina Follow Up    Patient presents with  Retinal Break/Detachment.  In right eye.  Severity is moderate.  Duration of 6 months.  Since onset it is stable.  I, the attending physician,  performed the HPI with the patient and updated documentation appropriately. Additional comments: 6 Month f\u OU. FP Pt states vision has been stable. Pt being treated for scleritis on 40 mg Prednisone, currently 1 week today. Also using Eysuvis OU BID, since mid Nov.       Last edited by Elyse Jarvis on 04/29/2020  4:14 PM. (History)      Referring physician: [name], L.[name], MD [address] [place],  [place] [address]  HISTORICAL INFORMATION:   Selected notes from the MEDICAL RECORD NUMBER       CURRENT MEDICATIONS: No current outpatient medications on file. (Ophthalmic Drugs)   No current facility-administered medications for this visit. (Ophthalmic Drugs)   Current Outpatient Medications (Other)  Medication Sig  . ALPRAZolam (XANAX) 0.25 MG tablet 1-2 tablets by mouth daily as needed (Patient not taking: Reported on 06/24/2019)  . clonazePAM (KLONOPIN) 0.5 MG tablet Take 0.5 mg by mouth daily as needed.  . finasteride (PROSCAR) 5 MG tablet Take 2.5 mg by mouth daily.   Marland Kitchen loratadine (CLARITIN) 10 MG tablet Take 10 mg by mouth daily as needed.   Marland Kitchen OVER THE COUNTER MEDICATION Place 1 drop into both eyes daily as needed (for dryness of eyes). [employer] brand dry eye drops  . pantoprazole (PROTONIX) 20 MG tablet Take 20 mg by mouth daily as needed.  . propranolol (INDERAL) 20 MG tablet As directed, as needed. (Patient not taking:  Reported on 06/24/2019)  . propranolol (INDERAL) 20 MG tablet TAKE AS DIRECTED AS NEEDED (Patient taking differently: Take 20 mg by mouth daily as needed. )  . SUMAtriptan (IMITREX) 100 MG tablet TAKE 1 TABLET (100 MG TOTAL) BY MOUTH EVERY 2 (TWO) HOURS AS NEEDED. (Patient taking differently: Take 100 mg by mouth every 2 (two) hours as needed. )   No current facility-administered medications for this visit. (Other)      REVIEW OF SYSTEMS:    ALLERGIES Allergies  Allergen Reactions  . Tetracyclines & Related Other (See Comments)    GI upset    PAST MEDICAL HISTORY Past Medical History:  Diagnosis Date  . Migraines    History reviewed. No pertinent surgical history.  FAMILY HISTORY Family History  Problem Relation Age of Onset  . Heart attack Father     SOCIAL HISTORY Social History   Tobacco Use  . Smoking status: Never Smoker  . Smokeless tobacco: Never Used  Substance Use Topics  . Alcohol use: No  . Drug use: No         OPHTHALMIC EXAM:  Base Eye Exam    Visual Acuity (Snellen - Linear)      Right Left   Dist [place] 20/20 20/20       Tonometry (Tonopen, 4:15 PM)      Right Left   Pressure 12 13  Pupils      Pupils Dark Light Shape React APD   Right PERRL 4 3 Round Brisk None   Left PERRL 4 3 Round Brisk None       Visual Fields (Counting fingers)      Left Right    Full Full       Neuro/Psych    Oriented x3: Yes   Mood/Affect: Normal       Dilation    Both eyes: 1.0% Mydriacyl, 2.5% Phenylephrine @ 4:15 PM        Slit Lamp and Fundus Exam    External Exam      Right Left   External Normal Normal       Slit Lamp Exam      Right Left   Lids/Lashes Normal Normal   Conjunctiva/Sclera White and quiet White and quiet   Cornea Clear Clear   Anterior Chamber Deep and quiet Deep and quiet   Iris Round and reactive Round and reactive   Anterior Vitreous Normal Normal       Fundus Exam      Right Left   Posterior Vitreous  Central vitreous floaters Central vitreous floaters   Disc Normal Normal   C/D Ratio 0.2 0.2   Macula Normal Normal   Vessels Normal Normal   Periphery GOOD RETINOPEXY BREAK AT 11, with some encroachment into the laser demarcation zone on the superior edge, now completely surrounded by further retinopexy  Normal          IMAGING AND PROCEDURES  Imaging and Procedures for 04/29/20  Color Fundus Photography Optos - OU - Both Eyes       Right Eye Progression has been stable. Disc findings include normal observations. Macula : normal observations. Vessels : normal observations. Periphery : normal observations.   Left Eye Progression has been stable. Disc findings include normal observations. Macula : normal observations. Periphery : normal observations.   Notes OD laser retinopexy visualized superotemporally.  No other retinal holes or tears, medial clear, PVD.                ASSESSMENT/PLAN:  Horseshoe retinal tear of right eye Good retinopexy, no new retinal breaks  Scleritis, right Follow-up with Dr. Arville Care as scheduled      ICD-10-CM   1. Horseshoe retinal tear of right eye  H33.311 Color Fundus Photography Optos - OU - Both Eyes  2. Scleritis, right  H15.001     1.  2.  3.  Ophthalmic Meds Ordered this visit:  No orders of the defined types were placed in this encounter.      Return in about 1 year (around 04/29/2021) for DILATE OU, COLOR FP.  Patient Instructions  Patient to notify the office probably new onset visual symptoms flashes floaters discomfort    Explained the diagnoses, plan, and follow up with the patient and they expressed understanding.  Patient expressed understanding of the importance of proper follow up care.   Alford Highland Stevin Bielinski M.D. Diseases & Surgery of the Retina and Vitreous [facility] 04/29/20     Abbreviations: M myopia (nearsighted); A astigmatism; H hyperopia (farsighted); P presbyopia; Mrx  spectacle prescription;  CTL contact lenses; OD right eye; OS left eye; OU both eyes  XT exotropia; ET esotropia; PEK punctate epithelial keratitis; PEE punctate epithelial erosions; DES dry eye syndrome; MGD meibomian gland dysfunction; ATs artificial tears; PFAT's preservative free artificial tears; NSC nuclear sclerotic cataract; PSC posterior subcapsular cataract; ERM epi-retinal  membrane; PVD posterior vitreous detachment; RD retinal detachment; DM diabetes mellitus; DR diabetic retinopathy; NPDR non-proliferative diabetic retinopathy; PDR proliferative diabetic retinopathy; CSME clinically significant macular edema; DME diabetic macular edema; dbh dot blot hemorrhages; CWS cotton wool spot; POAG primary open angle glaucoma; C/D cup-to-disc ratio; HVF humphrey visual field; GVF goldmann visual field; OCT optical coherence tomography; IOP intraocular pressure; BRVO Branch retinal vein occlusion; CRVO central retinal vein occlusion; CRAO central retinal artery occlusion; BRAO branch retinal artery occlusion; RT retinal tear; SB scleral buckle; PPV pars plana vitrectomy; VH Vitreous hemorrhage; PRP panretinal laser photocoagulation; IVK intravitreal kenalog; VMT vitreomacular traction; MH Macular hole;  NVD neovascularization of the disc; NVE neovascularization elsewhere; AREDS age related eye disease study; ARMD age related macular degeneration; POAG primary open angle glaucoma; EBMD epithelial/anterior basement membrane dystrophy; ACIOL anterior chamber intraocular lens; IOL intraocular lens; PCIOL posterior chamber intraocular lens; Phaco/IOL phacoemulsification with intraocular lens placement; [place] photorefractive keratectomy; LASIK laser assisted in situ keratomileusis; HTN hypertension; DM diabetes mellitus; COPD chronic obstructive pulmonary disease

## 2020-04-29 NOTE — Assessment & Plan Note (Signed)
Follow-up with Dr. Arville Care as scheduled

## 2020-05-15 DIAGNOSIS — H15003 Unspecified scleritis, bilateral: Secondary | ICD-10-CM | POA: Diagnosis not present

## 2020-05-15 DIAGNOSIS — H3091 Unspecified chorioretinal inflammation, right eye: Secondary | ICD-10-CM | POA: Diagnosis not present

## 2020-05-15 DIAGNOSIS — H35371 Puckering of macula, right eye: Secondary | ICD-10-CM | POA: Diagnosis not present

## 2020-05-20 DIAGNOSIS — Z7952 Long term (current) use of systemic steroids: Secondary | ICD-10-CM | POA: Diagnosis not present

## 2020-05-20 DIAGNOSIS — H3581 Retinal edema: Secondary | ICD-10-CM | POA: Diagnosis not present

## 2020-05-20 DIAGNOSIS — H15003 Unspecified scleritis, bilateral: Secondary | ICD-10-CM | POA: Diagnosis not present

## 2020-05-20 DIAGNOSIS — R760 Raised antibody titer: Secondary | ICD-10-CM | POA: Diagnosis not present

## 2020-05-20 DIAGNOSIS — Z79899 Other long term (current) drug therapy: Secondary | ICD-10-CM | POA: Diagnosis not present

## 2020-06-19 DIAGNOSIS — R202 Paresthesia of skin: Secondary | ICD-10-CM | POA: Diagnosis not present

## 2020-06-19 DIAGNOSIS — H15003 Unspecified scleritis, bilateral: Secondary | ICD-10-CM | POA: Diagnosis not present

## 2020-06-19 DIAGNOSIS — R760 Raised antibody titer: Secondary | ICD-10-CM | POA: Diagnosis not present

## 2020-06-20 DIAGNOSIS — M5416 Radiculopathy, lumbar region: Secondary | ICD-10-CM | POA: Diagnosis not present

## 2020-06-20 DIAGNOSIS — Z79899 Other long term (current) drug therapy: Secondary | ICD-10-CM | POA: Diagnosis not present

## 2020-06-20 DIAGNOSIS — R94131 Abnormal electromyogram [EMG]: Secondary | ICD-10-CM | POA: Diagnosis not present

## 2020-06-20 DIAGNOSIS — R202 Paresthesia of skin: Secondary | ICD-10-CM | POA: Diagnosis not present

## 2020-06-20 DIAGNOSIS — H15003 Unspecified scleritis, bilateral: Secondary | ICD-10-CM | POA: Diagnosis not present

## 2020-06-20 DIAGNOSIS — H3581 Retinal edema: Secondary | ICD-10-CM | POA: Diagnosis not present

## 2020-06-20 DIAGNOSIS — R760 Raised antibody titer: Secondary | ICD-10-CM | POA: Diagnosis not present

## 2020-06-21 DIAGNOSIS — R760 Raised antibody titer: Secondary | ICD-10-CM | POA: Diagnosis not present

## 2020-06-21 DIAGNOSIS — H15003 Unspecified scleritis, bilateral: Secondary | ICD-10-CM | POA: Diagnosis not present

## 2020-06-21 DIAGNOSIS — Z79899 Other long term (current) drug therapy: Secondary | ICD-10-CM | POA: Diagnosis not present

## 2020-07-05 DIAGNOSIS — Z79899 Other long term (current) drug therapy: Secondary | ICD-10-CM | POA: Diagnosis not present

## 2020-07-05 DIAGNOSIS — H15003 Unspecified scleritis, bilateral: Secondary | ICD-10-CM | POA: Diagnosis not present

## 2020-07-18 DIAGNOSIS — Z79899 Other long term (current) drug therapy: Secondary | ICD-10-CM | POA: Diagnosis not present

## 2020-07-18 DIAGNOSIS — H15003 Unspecified scleritis, bilateral: Secondary | ICD-10-CM | POA: Diagnosis not present

## 2020-07-18 DIAGNOSIS — H15043 Scleritis with corneal involvement, bilateral: Secondary | ICD-10-CM | POA: Diagnosis not present

## 2020-07-18 DIAGNOSIS — H3581 Retinal edema: Secondary | ICD-10-CM | POA: Diagnosis not present

## 2020-07-19 DIAGNOSIS — R7309 Other abnormal glucose: Secondary | ICD-10-CM | POA: Diagnosis not present

## 2020-07-23 DIAGNOSIS — R202 Paresthesia of skin: Secondary | ICD-10-CM | POA: Diagnosis not present

## 2020-07-23 DIAGNOSIS — H15003 Unspecified scleritis, bilateral: Secondary | ICD-10-CM | POA: Diagnosis not present

## 2020-07-23 DIAGNOSIS — R760 Raised antibody titer: Secondary | ICD-10-CM | POA: Diagnosis not present

## 2020-07-31 ENCOUNTER — Emergency Department (HOSPITAL_COMMUNITY)
Admission: EM | Admit: 2020-07-31 | Discharge: 2020-07-31 | Disposition: A | Discharge status: Home / Self Care | Payer: BC Managed Care – PPO | Attending: Emergency Medicine | Admitting: Emergency Medicine

## 2020-07-31 ENCOUNTER — Emergency Department (HOSPITAL_COMMUNITY): Payer: BC Managed Care – PPO

## 2020-07-31 ENCOUNTER — Encounter (HOSPITAL_COMMUNITY): Payer: Self-pay

## 2020-07-31 DIAGNOSIS — R002 Palpitations: Secondary | ICD-10-CM | POA: Insufficient documentation

## 2020-07-31 DIAGNOSIS — R0602 Shortness of breath: Secondary | ICD-10-CM | POA: Diagnosis not present

## 2020-07-31 DIAGNOSIS — R079 Chest pain, unspecified: Secondary | ICD-10-CM | POA: Diagnosis not present

## 2020-07-31 DIAGNOSIS — F419 Anxiety disorder, unspecified: Secondary | ICD-10-CM | POA: Insufficient documentation

## 2020-07-31 DIAGNOSIS — R202 Paresthesia of skin: Secondary | ICD-10-CM | POA: Insufficient documentation

## 2020-07-31 DIAGNOSIS — M4802 Spinal stenosis, cervical region: Secondary | ICD-10-CM | POA: Diagnosis not present

## 2020-07-31 DIAGNOSIS — R072 Precordial pain: Secondary | ICD-10-CM | POA: Diagnosis not present

## 2020-07-31 DIAGNOSIS — R519 Headache, unspecified: Secondary | ICD-10-CM | POA: Diagnosis not present

## 2020-07-31 DIAGNOSIS — R531 Weakness: Secondary | ICD-10-CM | POA: Diagnosis not present

## 2020-07-31 DIAGNOSIS — M549 Dorsalgia, unspecified: Secondary | ICD-10-CM | POA: Insufficient documentation

## 2020-07-31 DIAGNOSIS — R21 Rash and other nonspecific skin eruption: Secondary | ICD-10-CM | POA: Diagnosis not present

## 2020-07-31 DIAGNOSIS — R200 Anesthesia of skin: Secondary | ICD-10-CM

## 2020-07-31 DIAGNOSIS — R29818 Other symptoms and signs involving the nervous system: Secondary | ICD-10-CM | POA: Diagnosis not present

## 2020-07-31 DIAGNOSIS — J3489 Other specified disorders of nose and nasal sinuses: Secondary | ICD-10-CM | POA: Diagnosis not present

## 2020-07-31 DIAGNOSIS — M47812 Spondylosis without myelopathy or radiculopathy, cervical region: Secondary | ICD-10-CM | POA: Diagnosis not present

## 2020-07-31 DIAGNOSIS — R42 Dizziness and giddiness: Secondary | ICD-10-CM

## 2020-07-31 DIAGNOSIS — M2578 Osteophyte, vertebrae: Secondary | ICD-10-CM | POA: Diagnosis not present

## 2020-07-31 DIAGNOSIS — R0789 Other chest pain: Secondary | ICD-10-CM | POA: Diagnosis not present

## 2020-07-31 LAB — COMPREHENSIVE METABOLIC PANEL
ALT: 22 U/L (ref 0–44)
AST: 23 U/L (ref 15–41)
Albumin: 4.2 g/dL (ref 3.5–5.0)
Alkaline Phosphatase: 45 U/L (ref 38–126)
Anion gap: 16 — ABNORMAL HIGH (ref 5–15)
BUN: 20 mg/dL (ref 6–20)
CO2: 19 mmol/L — ABNORMAL LOW (ref 22–32)
Calcium: 9.8 mg/dL (ref 8.9–10.3)
Chloride: 102 mmol/L (ref 98–111)
Creatinine, Ser: 1.1 mg/dL (ref 0.61–1.24)
GFR, Estimated: >60 mL/min (ref >60)
Glucose, Bld: 127 mg/dL — ABNORMAL HIGH (ref 70–99)
Potassium: 3.5 mmol/L (ref 3.5–5.1)
Sodium: 137 mmol/L (ref 135–145)
Total Bilirubin: 1.4 mg/dL — ABNORMAL HIGH (ref 0.3–1.2)
Total Protein: 7.7 g/dL (ref 6.5–8.1)

## 2020-07-31 LAB — CBC WITH DIFFERENTIAL/PLATELET
Abs Immature Granulocytes: 0.07 10*3/uL (ref 0.00–0.07)
Basophils Absolute: 0 10*3/uL (ref 0.0–0.1)
Basophils Relative: 0 %
Eosinophils Absolute: 0.2 10*3/uL (ref 0.0–0.5)
Eosinophils Relative: 2 %
HCT: 47.5 % (ref 39.0–52.0)
Hemoglobin: 15.5 g/dL (ref 13.0–17.0)
Immature Granulocytes: 1 %
Lymphocytes Relative: 25 %
Lymphs Abs: 2.4 10*3/uL (ref 0.7–4.0)
MCH: 32.3 pg (ref 26.0–34.0)
MCHC: 32.6 g/dL (ref 30.0–36.0)
MCV: 99 fL (ref 80.0–100.0)
Monocytes Absolute: 1.5 10*3/uL — ABNORMAL HIGH (ref 0.1–1.0)
Monocytes Relative: 16 %
Neutro Abs: 5.3 10*3/uL (ref 1.7–7.7)
Neutrophils Relative %: 56 %
Platelets: 221 10*3/uL (ref 150–400)
RBC: 4.8 MIL/uL (ref 4.22–5.81)
RDW: 14.7 % (ref 11.5–15.5)
WBC: 9.4 10*3/uL (ref 4.0–10.5)
nRBC: 0 % (ref 0.0–0.2)

## 2020-07-31 LAB — TROPONIN I (HIGH SENSITIVITY)
Troponin I (High Sensitivity): 6 ng/L (ref <18)
Troponin I (High Sensitivity): 14 ng/L (ref <18)

## 2020-07-31 LAB — TSH: TSH: 1.94 u[IU]/mL (ref 0.350–4.500)

## 2020-07-31 LAB — BRAIN NATRIURETIC PEPTIDE: B Natriuretic Peptide: 135.1 pg/mL — ABNORMAL HIGH (ref 0.0–100.0)

## 2020-07-31 LAB — MAGNESIUM: Magnesium: 2.3 mg/dL (ref 1.7–2.4)

## 2020-07-31 LAB — PROTIME-INR
INR: 0.9 (ref 0.8–1.2)
Prothrombin Time: 11.9 seconds (ref 11.4–15.2)

## 2020-07-31 LAB — LACTIC ACID, PLASMA: Lactic Acid, Venous: 1.6 mmol/L (ref 0.5–1.9)

## 2020-07-31 IMAGING — CT CT ANGIO HEAD
1 of 11 series · 5 of 35 positions shown · IV contrast (omnipaque)
Comparison: [DATE].

CLINICAL DATA: Vertebral artery dissection suspected. Dizziness.
Left leg weakness

EXAM:
CT ANGIOGRAPHY HEAD AND NECK
TECHNIQUE: Multidetector CT imaging of the head and neck was performed using
the standard protocol during bolus administration of intravenous
contrast. Multiplanar CT image reconstructions and MIPs were
obtained to evaluate the vascular anatomy. Carotid stenosis
measurements (when applicable) are obtained utilizing NASCET
criteria, using the distal internal carotid diameter as the
denominator.
CONTRAST:  75mL OMNIPAQUE IOHEXOL 350 MG/ML SOLN

[Series 11: cta neck axial · axial · 0.39mm/px · z∈[-327,-67]mm · 5 of 393 slices shown]
[im 66/393  soft-tissue]
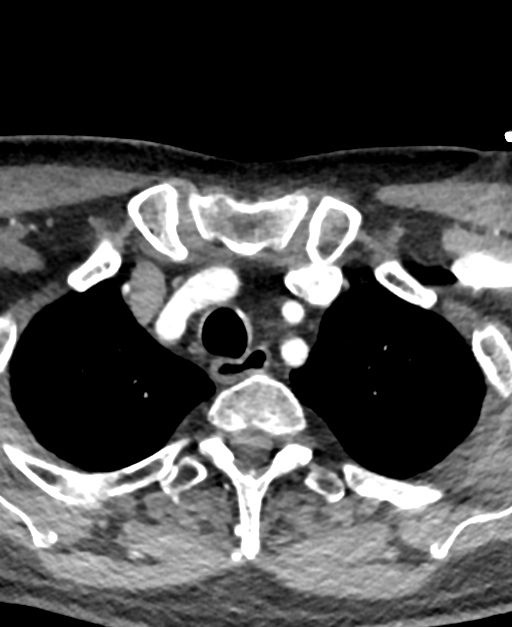
[im 131/393  bone]
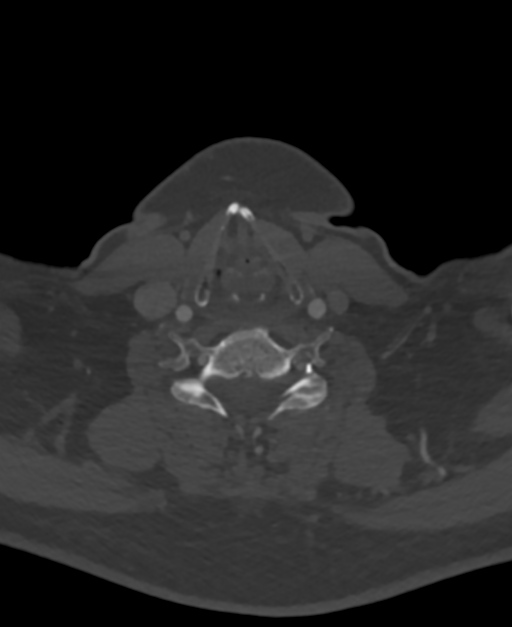
[im 197/393  soft-tissue]
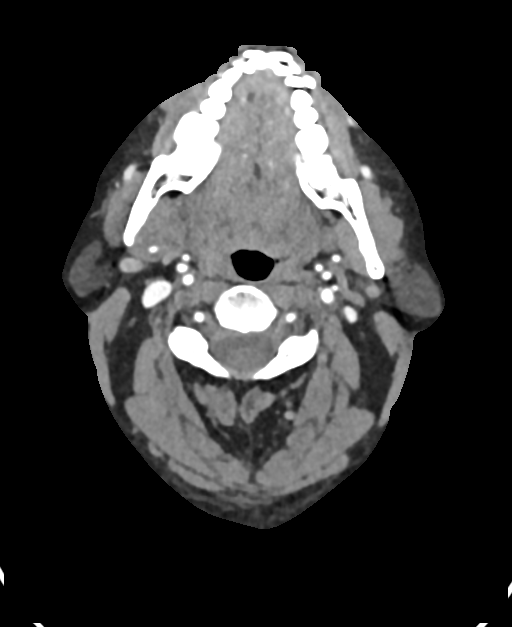
[im 262/393  bone]
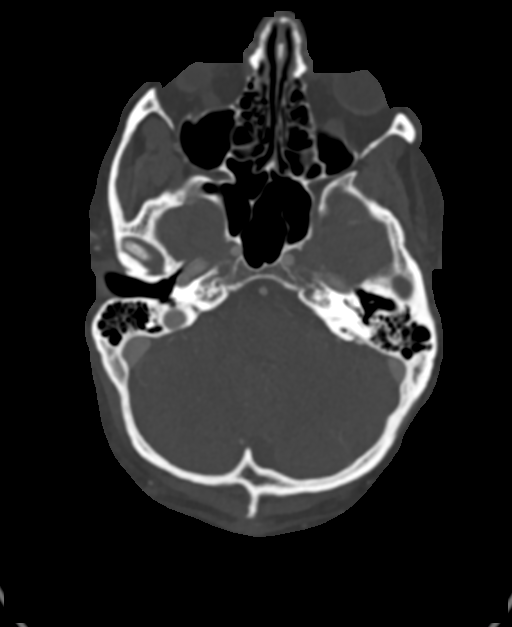
[im 327/393  soft-tissue]
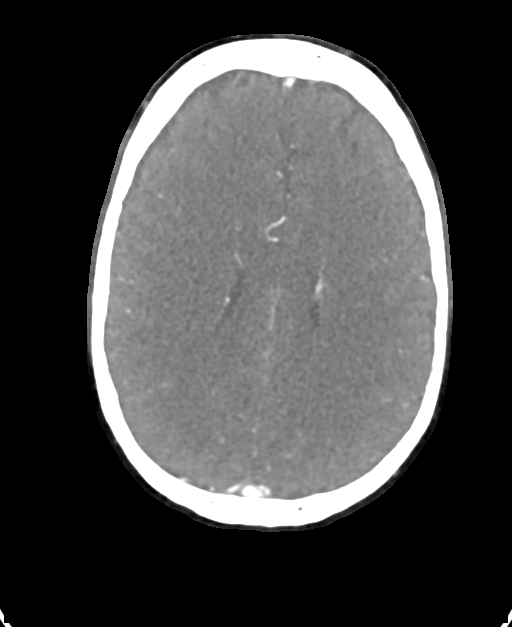

[5 of 35 positions shown; findings below may reference images not displayed]

FINDINGS: CT HEAD FINDINGS

Brain: No evidence of acute infarction, hemorrhage, hydrocephalus,
extra-axial collection or mass lesion/mass effect.

Vascular: No hyperdense vessel.

Skull: No acute fracture.

Sinuses: Visualized sinuses are clear.  Unremarkable orbits.

Orbits: No mastoid effusions.

Review of the MIP images confirms the above findings

CTA NECK FINDINGS

Aortic arch: Great vessel origins are patent.

Right carotid system: No evidence of dissection, stenosis (50% or
greater) or occlusion.

Left carotid system: No evidence of dissection, stenosis (50% or
greater) or occlusion. Minimal calcified atherosclerosis at the ICA
origin.

Vertebral arteries: Codominant. No evidence of dissection, stenosis
(50% or greater) or occlusion.

Skeleton: Mild multilevel cervical degenerative change.

Other neck: No mass or adenopathy.

Upper chest: Visualized lung apices are clear.

Review of the MIP images confirms the above findings

CTA HEAD FINDINGS

Anterior circulation: Intracranial internal carotid arteries,
anterior and middle cerebral arteries are patent. No significant
change from prior.

Posterior circulation: Intracranial vertebral arteries, basilar
artery, and posterior cerebral arteries are patent. Similar left
posterior communicating artery. No significant change from prior.

Venous sinuses: As permitted by contrast timing, patent.

Review of the MIP images confirms the above findings
IMPRESSION: 1. No evidence of acute intracranial abnormality.
2. No hemodynamically significant proximal stenosis or evidence of
dissection in the neck.
3. No large vessel occlusion or significant proximal stenosis
intracranially.
4. No significant change from the prior.

## 2020-07-31 IMAGING — MR MR HEAD WO/W CM
11 of 32 series · 15 of 48 positions shown · IV contrast (gadavist)
Comparison: Same day CTA.

CLINICAL DATA: Neuro deficit, acute stroke suspected.

EXAM:
MRI CERVICAL AND THORACIC SPINE WITHOUT AND WITH CONTRAST
TECHNIQUE: Multiplanar, multiecho pulse sequences of the brain and surrounding
structures were obtained without and with intravenous contrast.
Multiplanar and multiecho pulse sequences of the cervical spine, to
include the craniocervical junction and cervicothoracic junction,
and the thoracic spine, were obtained without and with intravenous
contrast.
CONTRAST:  10mL GADAVIST GADOBUTROL 1 MMOL/ML IV SOLN

[Series 2: DWI · axial · 3.0mm · 0.94mm/px · z∈[-99,+61]mm · 2 of 112 slices shown (1 of 2)]
[im 1/112]
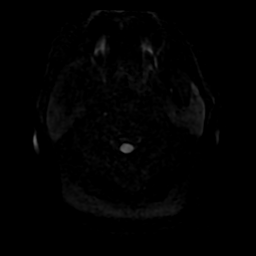
[im 112/112]
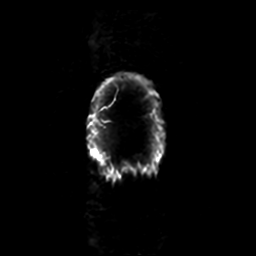

[Series 3: DWI · coronal · 4.0mm · 0.94mm/px · 1 of 80 slices shown (2 of 2)]
[im 1/80]
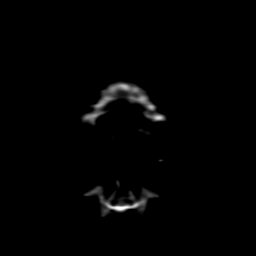

[Series 4: FLAIR · sagittal · 5.0mm · 0.23mm/px · 1 of 25 slices shown (1 of 4)]
[im 1/25]
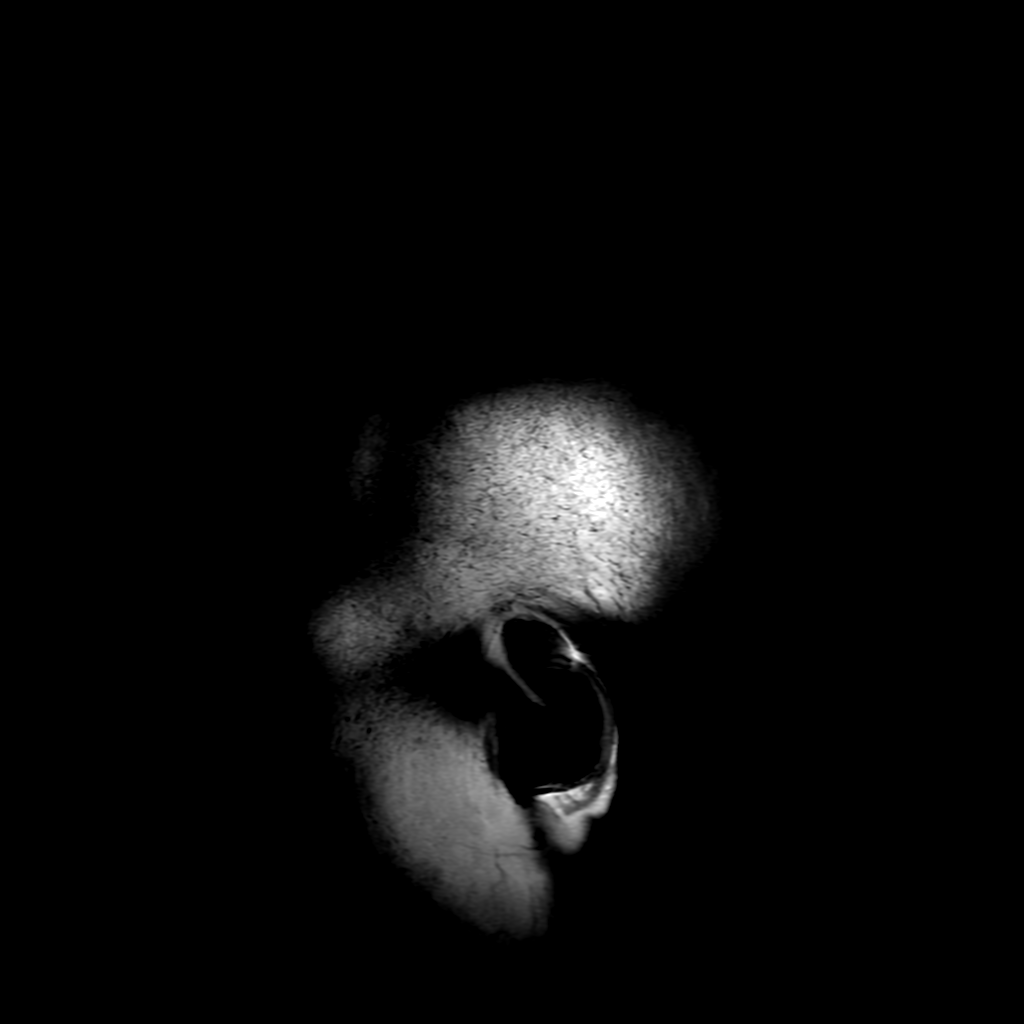

[Series 5: T2 · axial · 5.0mm · 0.23mm/px · 1 of 28 slices shown]
[im 1/28]
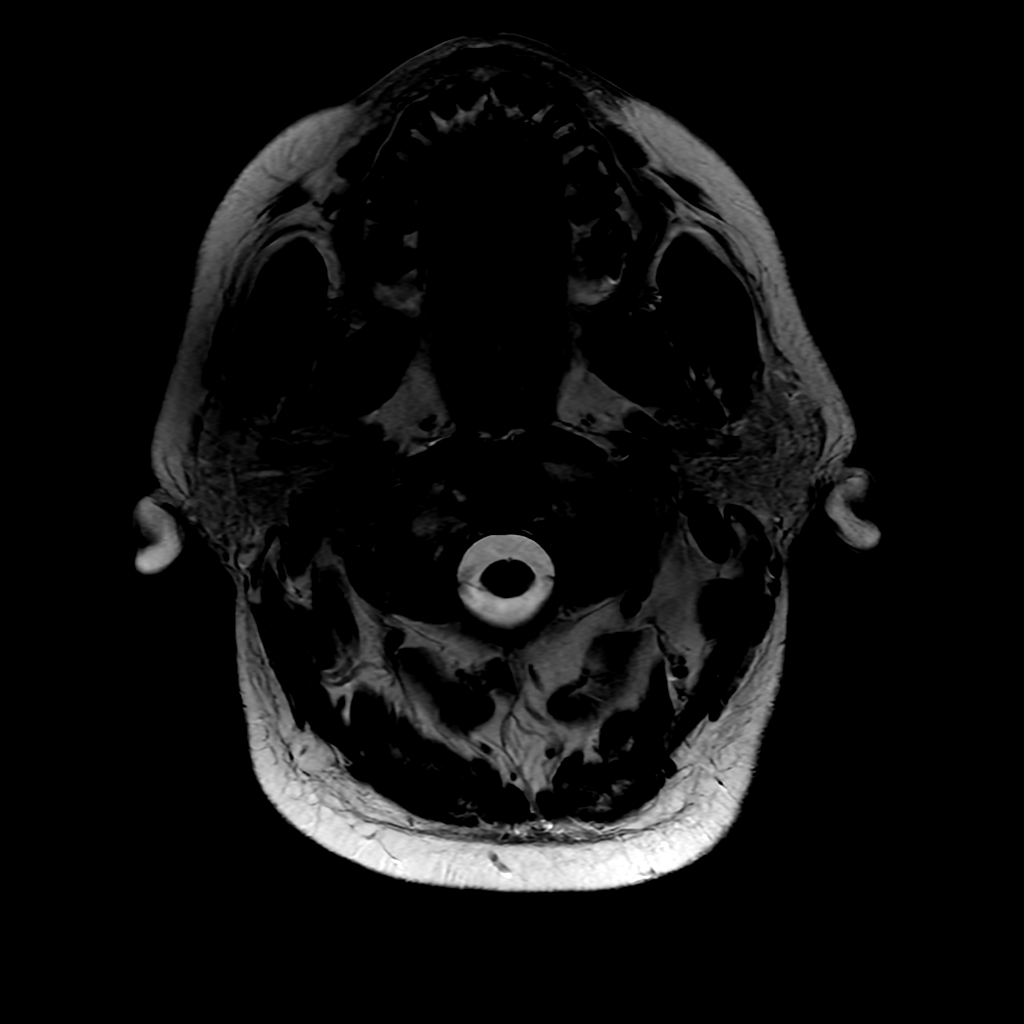

[Series 6: FLAIR · axial · 3.0mm · 0.45mm/px · 1 of 28 slices shown (2 of 4)]
[im 1/28]
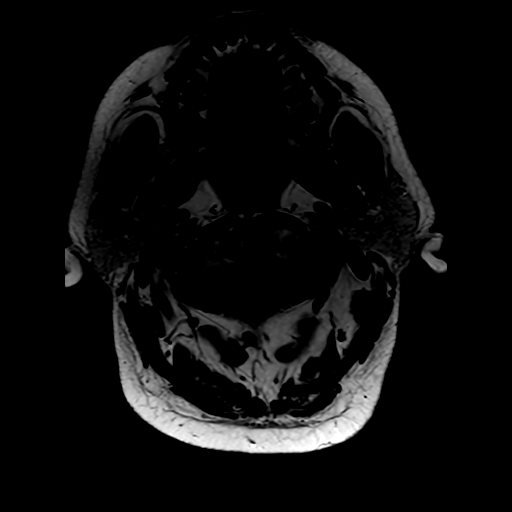

[Series 9: FLAIR · sagittal · 1.6mm · 0.49mm/px · 4 of 204 slices shown (3 of 4)]
[im 1/204]
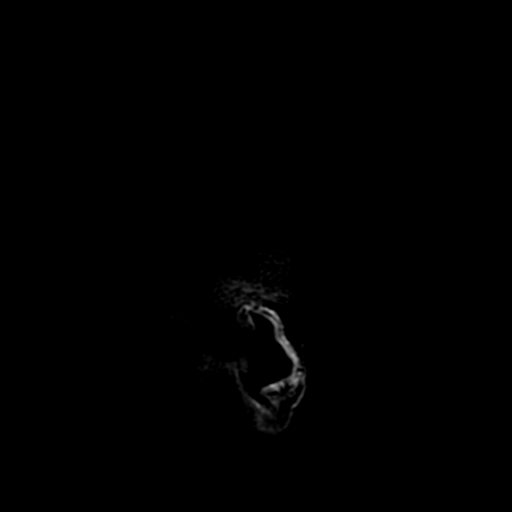
[im 68/204]
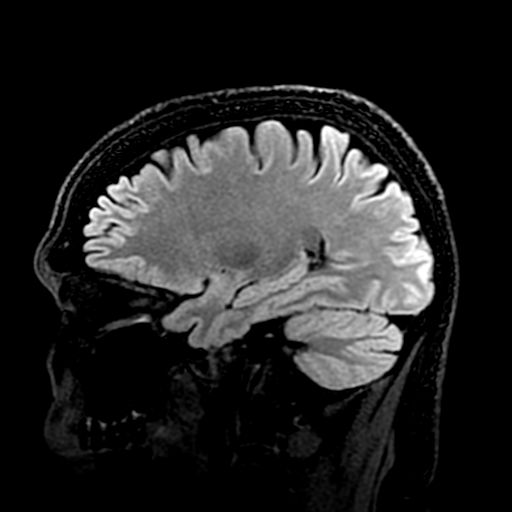
[im 136/204]
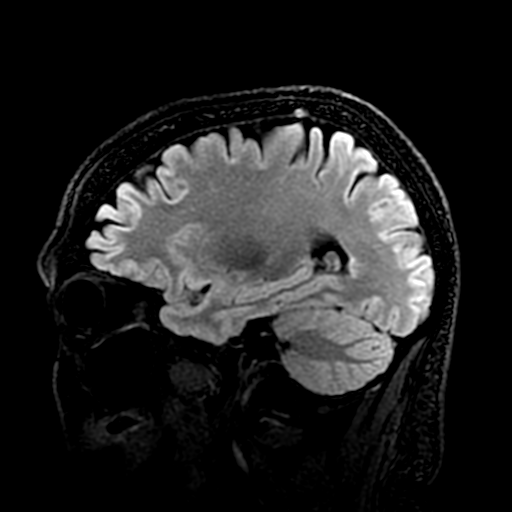
[im 204/204]
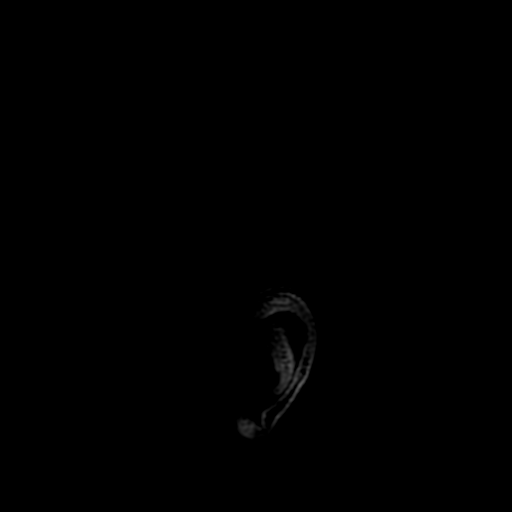

[Series 12: FLAIR · sagittal · 3.0mm · 0.43mm/px · 1 of 16 slices shown (4 of 4)]
[im 1/16]
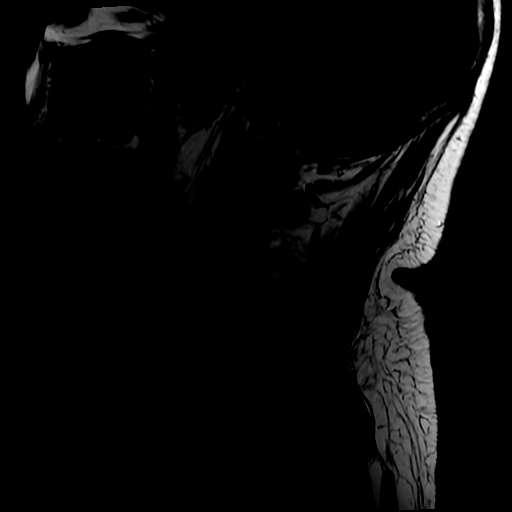

[Series 29: T1 post-contrast · axial · 3.0mm · 0.35mm/px · 1 of 36 slices shown (1 of 2)]
[im 1/36]
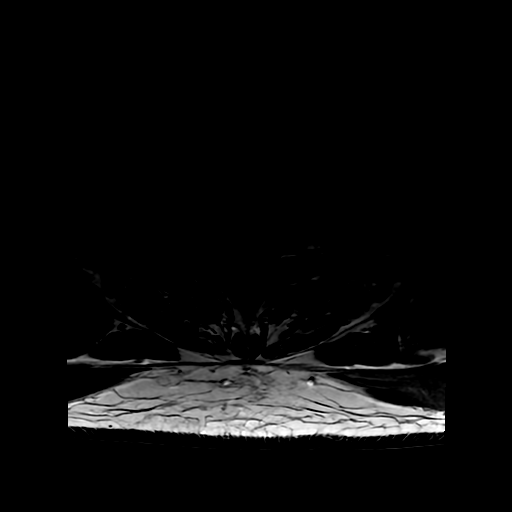

[Series 31: T1 post-contrast · axial · 4.0mm · 0.39mm/px · 1 of 39 slices shown (2 of 2)]
[im 1/39]
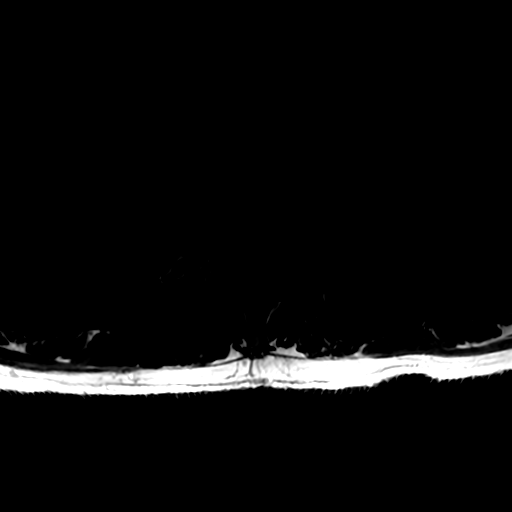

[Series 250: ADC · axial · 3.0mm · 0.94mm/px · 1 of 56 slices shown (1 of 2)]
[im 1/56]
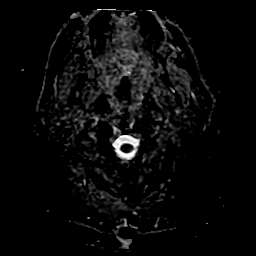

[Series 350: ADC · coronal · 4.0mm · 0.94mm/px · 1 of 40 slices shown (2 of 2)]
[im 1/40]
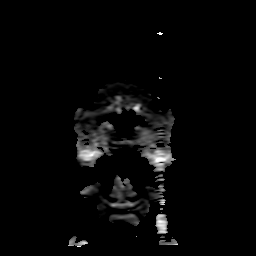

[15 of 48 positions shown; findings below may reference images not displayed]

FINDINGS: MRI HEAD FINDINGS

Brain: No acute infarction, hemorrhage, hydrocephalus, or
extra-axial collection. No substantial white matter disease. No
substantial change in the size/appearance of a T1 hyperintense mass
in the left olfactory groove, measuring approximately 1 cm (series
26, image 20; series 27, image 29) evaluation for enhancement this
lesion is limited given intrinsic T1 hyperintensity. Otherwise, no
abnormal enhancement.

Vascular: Major arterial flow voids are maintained at the skull
base. Further evaluated on same day CTA.

Skull and upper cervical spine: Normal marrow signal.

Sinuses/Orbits: Mild ethmoid air cell mucosal thickening without
air-fluid levels. Unremarkable orbits.

Other: No sizable mastoid effusions.

MRI CERVICAL SPINE FINDINGS

Alignment: No substantial sagittal subluxation. Straightening of the
normal cervical lordosis.

Vertebrae: Vertebral body heights are maintained. No specific
evidence of acute fracture, discitis/osteomyelitis, or suspicious
bone lesion.

Cord: Normal cord signal.  No abnormal cord enhancement.

Posterior Fossa, vertebral arteries, paraspinal tissues:
Unremarkable.

Disc levels:

C2-C3: No significant disc protrusion, foraminal stenosis, or canal
stenosis.

C3-C4: Small left eccentric posterior disc osteophyte complex and
left greater than right facet/uncovertebral hypertrophy. Resulting
moderate left and mild right foraminal stenosis and mild narrowing
of the left root entry zone without significant central canal
stenosis. No significant right foraminal stenosis.

C4-C5: Small posterior disc osteophyte complex and left greater than
right facet and uncovertebral hypertrophy. Resulting moderate left
and mild right foraminal stenosis. No significant canal stenosis.

C5-C6: Left greater than right facet and uncovertebral hypertrophy
with mild-to-moderate left foraminal stenosis. No significant canal
or right foraminal stenosis.

C6-C7: Left greater than right uncovertebral hypertrophy with
mild-to-moderate left foraminal stenosis. No significant canal or
right foraminal stenosis.

C7-T1: No significant disc protrusion, foraminal stenosis, or canal
stenosis.

MRI THORACIC SPINE FINDINGS

Alignment:  Normal

Vertebrae: Degenerative endplate Schmorl's nodes at multiple levels.
Vertebral body heights are maintained. No specific evidence of acute
fracture, discitis/osteomyelitis, or suspicious bone lesion.

Cord:  Normal cord signal and morphology.  No abnormal enhancement.

Paraspinal and other soft tissues: Unremarkable.

Disc levels: No significant disc protrusion, foraminal stenosis, or
canal stenosis.
IMPRESSION: MRI head:

1. No acute intracranial abnormality. Specifically, no acute
infarct.
2. No change in a 1 cm mass in the left olfactory groove, with
differential considerations described on the prior MRI from [facility]

MRI cervical spine:

1. Multilevel left foraminal stenosis, moderate at C3-C4 and C4-C5
and mild-to-moderate at C5-C6, and C6-C7. Mild right foraminal
stenosis C3-C4 and C4-C5.
2. No significant canal stenosis. Normal cord signal.

MRI thoracic spine:

No significant canal or foraminal stenosis.  Normal cord signal.

## 2020-07-31 IMAGING — DX DG CHEST 2V
2 series · 2 of 2 positions shown · non-contrast
Comparison: [DATE]

CLINICAL DATA: Chest pain and shortness of breath.

EXAM:
CHEST - 2 VIEW

[chest ap]
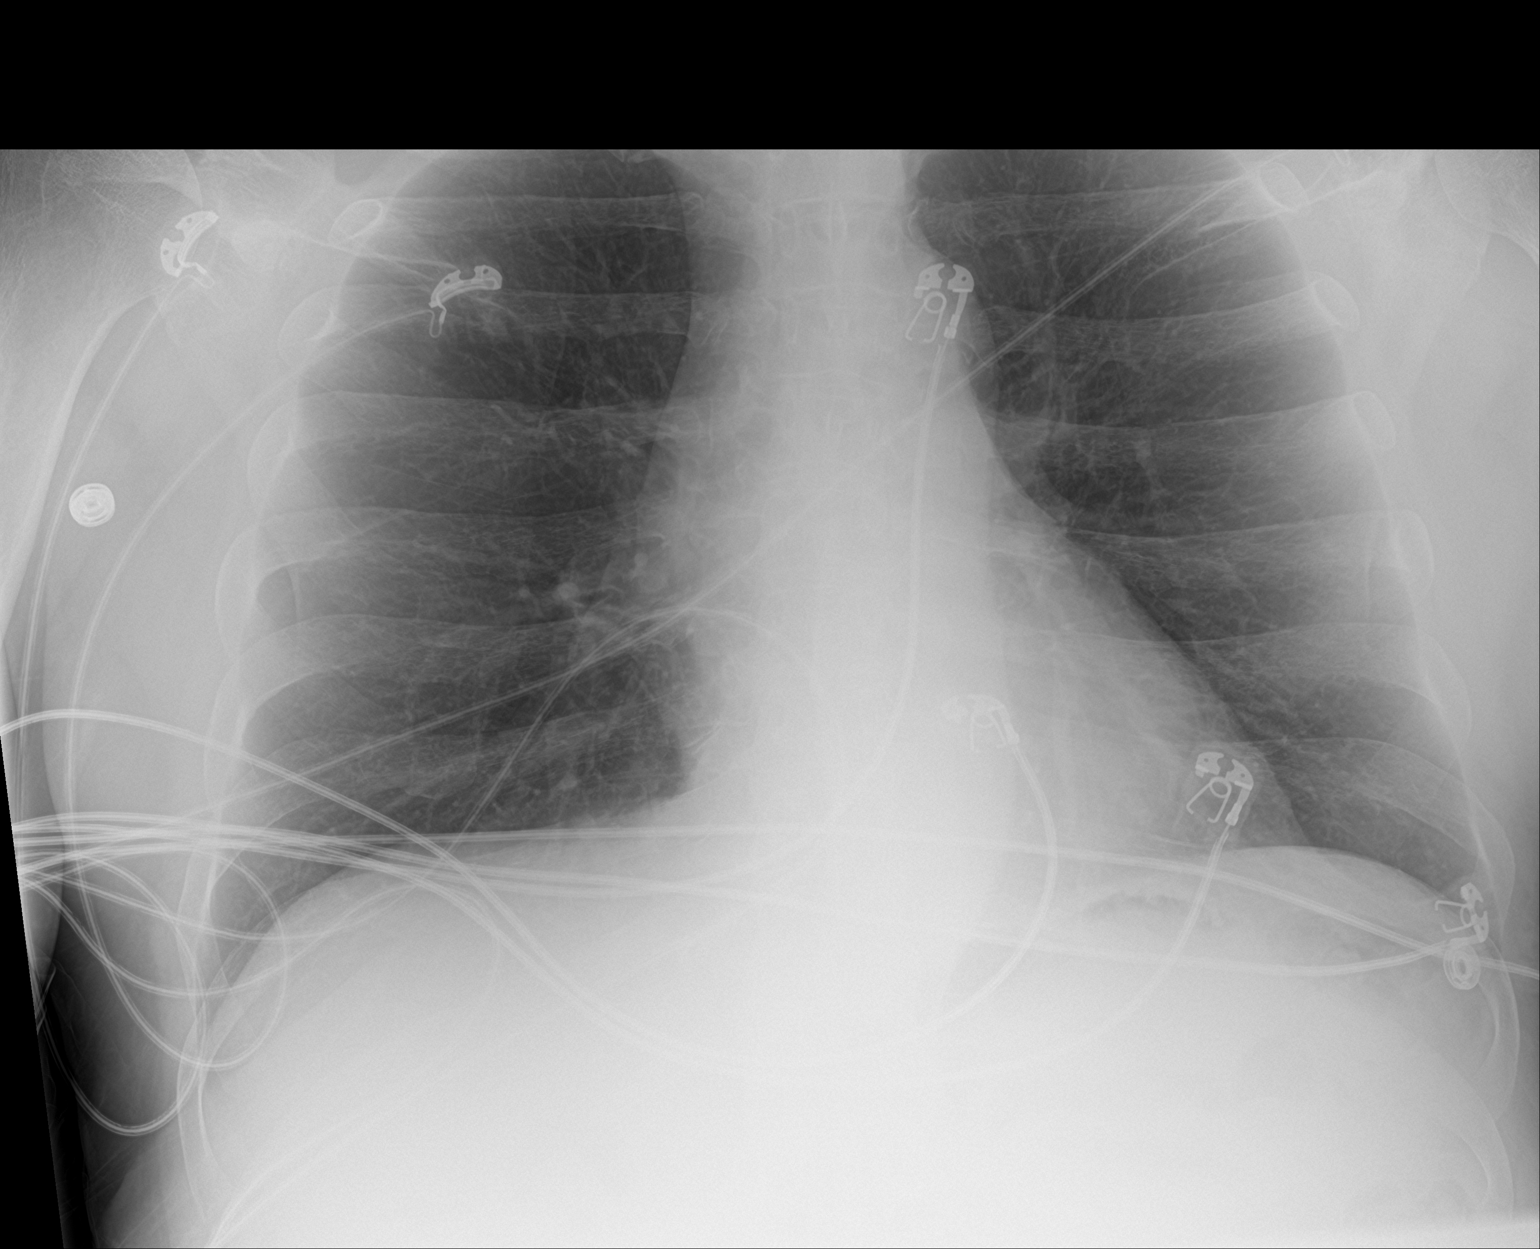

[chest lat]
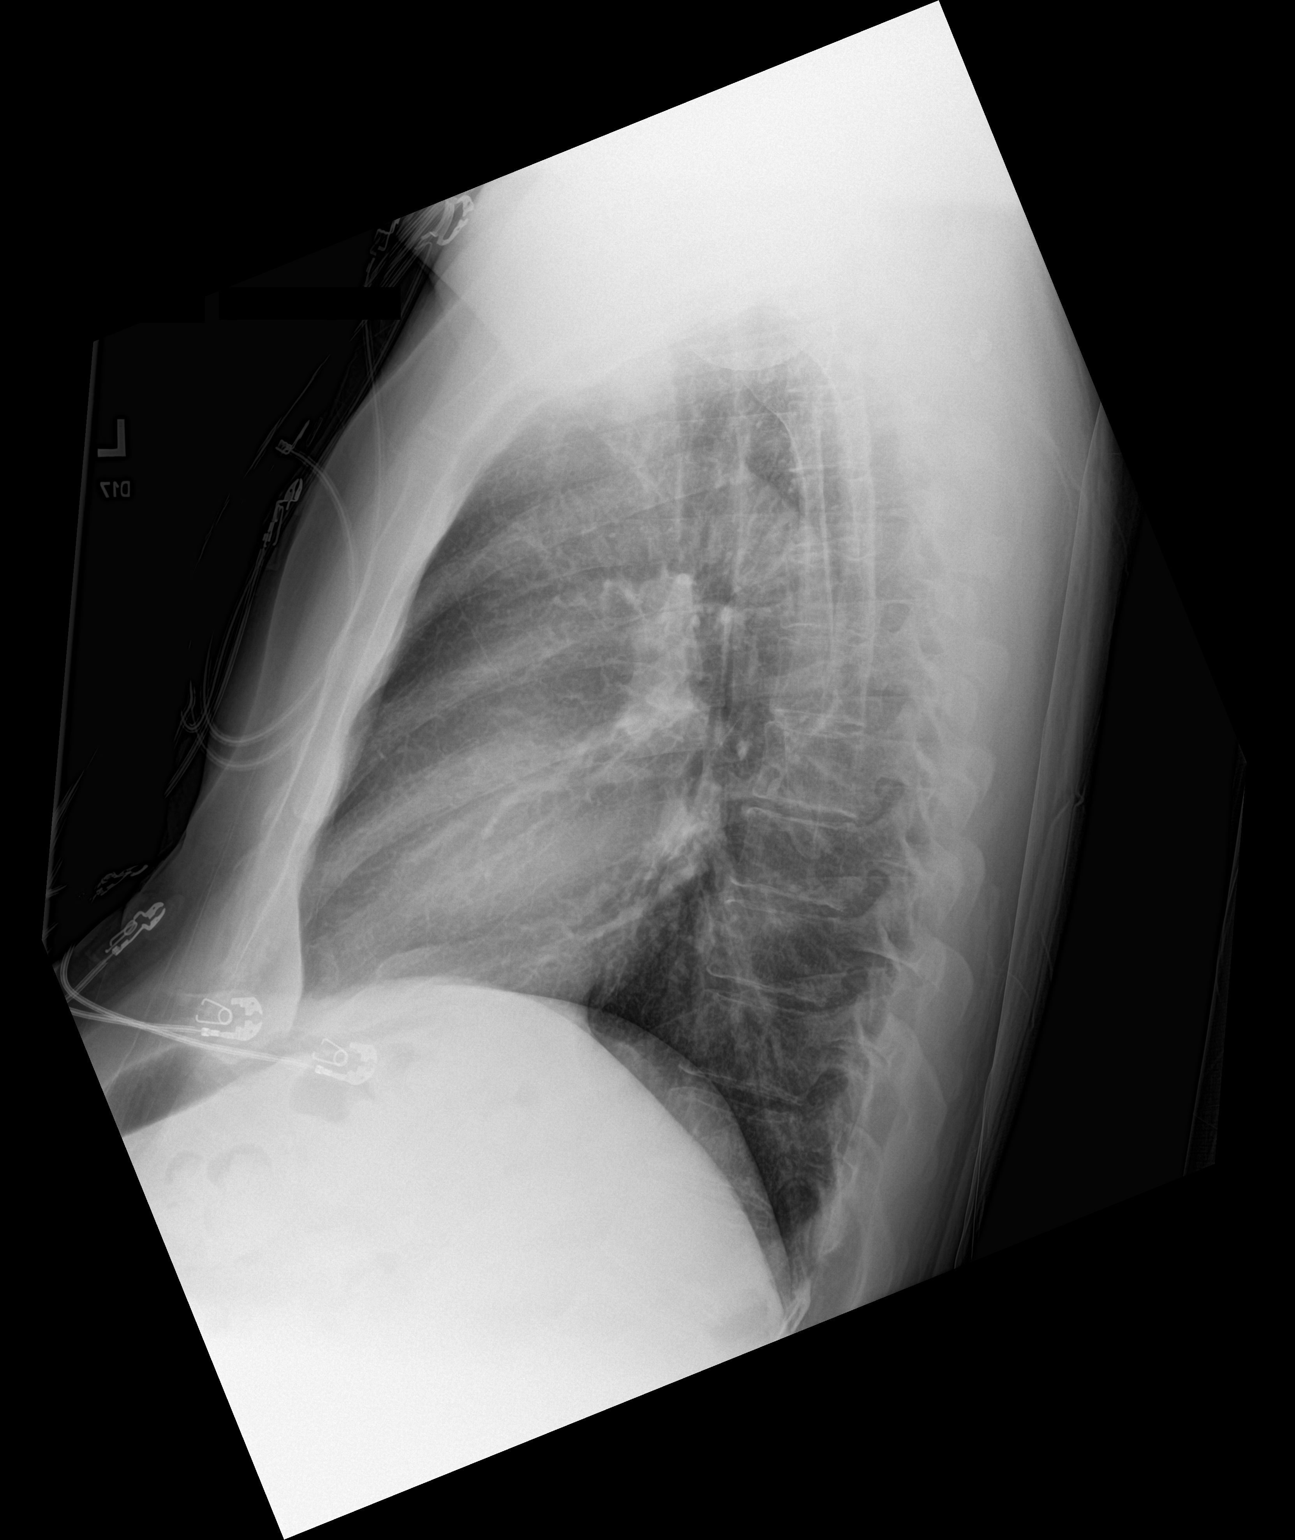

[2 of 2 positions shown; findings below may reference images not displayed]

FINDINGS: Both lungs are clear. Negative for a pneumothorax. Heart and
mediastinum are within normal limits. Trachea is midline. Bone
structures are unremarkable.
IMPRESSION: No active cardiopulmonary disease.

## 2020-07-31 MED ORDER — LORAZEPAM 2 MG/ML IJ SOLN
1.0000 mg | Freq: Once | INTRAMUSCULAR | Status: AC | PRN
  Administered 2020-07-31: 1 mg via INTRAVENOUS
  Filled 2020-07-31: qty 1

## 2020-07-31 MED ORDER — IOHEXOL 350 MG/ML SOLN
75.0000 mL | Freq: Once | INTRAVENOUS | Status: AC | PRN
  Administered 2020-07-31: 75 mL via INTRAVENOUS

## 2020-07-31 MED ORDER — GADOBUTROL 1 MMOL/ML IV SOLN
10.0000 mL | Freq: Once | INTRAVENOUS | Status: AC | PRN
  Administered 2020-07-31: 10 mL via INTRAVENOUS

## 2020-07-31 NOTE — Discharge Instructions (Signed)
   Your troponins were technically both negative.  Sometimes if it is continuing to rise we will check a third test. Please call your doctor to follow-up with a cardiologist if they feel that is appropriate.  Please discuss your visit here with your rheumatologist and ophthalmologist and let them know what happened to you.  Please return for the emergency department if this pain worsens or if you pass out.  As far as reflux Try to avoid things that may make this worse, most commonly these are spicy foods tomato based products fatty foods chocolate and peppermint.  Alcohol and tobacco can also make this worse.  Return to the emergency department for sudden worsening pain fever or inability to eat or drink.

## 2020-07-31 NOTE — ED Provider Notes (Signed)
[facility] EMERGENCY DEPARTMENT Provider Note   CSN: [id number] Arrival date & time: 07/31/20  5916     History No chief complaint on file.   Darren Wilson is a 60 y.o. male.  The history is provided by the patient and medical records. No language interpreter was used.  Chest Pain Pain location:  Substernal area and L chest Pain quality: aching, crushing and pressure   Pain radiates to:  Does not radiate Pain severity:  Moderate Onset quality:  Gradual Duration:  4 days Timing:  Constant Progression:  Waxing and waning Chronicity:  New Relieved by:  Nothing Worsened by:  Nothing Ineffective treatments:  None tried Associated symptoms: anxiety, back pain, dizziness, headache, numbness, palpitations, shortness of breath and weakness   Associated symptoms: no abdominal pain, no altered mental status, no anorexia, no cough, no diaphoresis, no fatigue, no fever, no lower extremity edema, no nausea, no near-syncope, no syncope and no vomiting        Past Medical History:  Diagnosis Date  . Migraines     Patient Active Problem List   Diagnosis Date Noted  . Scleritis, right 04/29/2020  . Horseshoe retinal tear of right eye 07/31/2019  . Posterior vitreous detachment of right eye 07/31/2019  . Posterior vitreous detachment of left eye 07/31/2019  . Cervical nerve root impingement 05/01/2011  . IBS (irritable bowel syndrome) 03/05/2011  . Routine general medical examination at a health care facility 02/24/2011  . MIGRAINE HEADACHE 07/29/2009  . INSOMNIA 07/29/2009  . RUQ PAIN 07/29/2009    No past surgical history on file.     Family History  Problem Relation Age of Onset  . Heart attack Father     Social History   Tobacco Use  . Smoking status: Never Smoker  . Smokeless tobacco: Never Used  Substance Use Topics  . Alcohol use: No  . Drug use: No    Home Medications Prior to Admission medications   Medication Sig Start Date End  Date Taking? Authorizing Provider  ALPRAZolam Prudy Feeler) 0.25 MG tablet 1-2 tablets by mouth daily as needed Patient not taking: Reported on 06/24/2019 05/01/11   Dianne Dun, MD  clonazePAM (KLONOPIN) 0.5 MG tablet Take 0.5 mg by mouth daily as needed. 03/21/19   [provider]  finasteride (PROSCAR) 5 MG tablet Take 2.5 mg by mouth daily.  04/18/19   [provider]  loratadine (CLARITIN) 10 MG tablet Take 10 mg by mouth daily as needed.  02/16/14   [provider]  OVER THE COUNTER MEDICATION Place 1 drop into both eyes daily as needed (for dryness of eyes). [employer] brand dry eye drops    [provider]  pantoprazole (PROTONIX) 20 MG tablet Take 20 mg by mouth daily as needed.    [provider]  propranolol (INDERAL) 20 MG tablet As directed, as needed. Patient not taking: Reported on 06/24/2019 03/05/11   Dianne Dun, MD  propranolol (INDERAL) 20 MG tablet TAKE AS DIRECTED AS NEEDED Patient taking differently: Take 20 mg by mouth daily as needed.  09/01/12   Dianne Dun, MD  SUMAtriptan (IMITREX) 100 MG tablet TAKE 1 TABLET (100 MG TOTAL) BY MOUTH EVERY 2 (TWO) HOURS AS NEEDED. Patient taking differently: Take 100 mg by mouth every 2 (two) hours as needed.  09/29/13   Eustaquio Boyden, MD    Allergies    Tetracyclines & related  Review of Systems   Review of Systems  Constitutional: Negative  for chills, diaphoresis, fatigue and fever.  HENT: Negative for congestion.   Eyes: Negative for visual disturbance.  Respiratory: Positive for chest tightness and shortness of breath. Negative for cough and wheezing.   Cardiovascular: Positive for chest pain and palpitations. Negative for syncope and near-syncope.  Gastrointestinal: Negative for abdominal pain, anorexia, constipation, diarrhea, nausea and vomiting.  Genitourinary: Negative for dysuria, flank pain and frequency.  Musculoskeletal: Positive for back pain and neck pain. Negative for neck  stiffness.  Skin: Positive for rash (poison ivy on R rticep area). Negative for wound.  Neurological: Positive for dizziness, tremors, weakness, light-headedness, numbness and headaches. Negative for seizures, syncope, facial asymmetry and speech difficulty.  Psychiatric/Behavioral: Negative for agitation and confusion.  All other systems reviewed and are negative.   Physical Exam Updated Vital Signs BP (!) 162/98 (BP Location: Right Arm)   Pulse 86   Temp 98.5 F (36.9 C) (Oral)   Resp 18   SpO2 100%   Physical Exam Vitals and nursing note reviewed.  Constitutional:      General: He is not in acute distress.    Appearance: He is well-developed. He is not ill-appearing or toxic-appearing.  HENT:     Head: Normocephalic and atraumatic.     Nose: No congestion or rhinorrhea.     Mouth/Throat:     Mouth: Mucous membranes are moist.     Pharynx: No oropharyngeal exudate or posterior oropharyngeal erythema.  Eyes:     Extraocular Movements: Extraocular movements intact.     Conjunctiva/sclera: Conjunctivae normal.     Pupils: Pupils are equal, round, and reactive to light.  Neck:     Vascular: No carotid bruit.  Cardiovascular:     Rate and Rhythm: Normal rate and regular rhythm.     Pulses: Normal pulses.     Heart sounds: No murmur heard.   Pulmonary:     Effort: Pulmonary effort is normal. No respiratory distress.     Breath sounds: Normal breath sounds. No wheezing, rhonchi or rales.  Chest:     Chest wall: No tenderness.  Abdominal:     General: Abdomen is flat.     Palpations: Abdomen is soft.     Tenderness: There is no abdominal tenderness. There is no right CVA tenderness, left CVA tenderness, guarding or rebound.  Musculoskeletal:        General: No tenderness.     Cervical back: Neck supple. No tenderness.     Right lower leg: No tenderness. No edema.     Left lower leg: No tenderness. No edema.  Skin:    General: Skin is warm and dry.     Capillary  Refill: Capillary refill takes less than 2 seconds.     Findings: Rash (R upper arm) present. No erythema.  Neurological:     Mental Status: He is alert.     GCS: GCS eye subscore is 4. GCS verbal subscore is 5. GCS motor subscore is 6.     Cranial Nerves: No cranial nerve deficit, dysarthria or facial asymmetry.     Sensory: Sensory deficit present.     Motor: Tremor present. No weakness, abnormal muscle tone or seizure activity.     Coordination: Finger-Nose-Finger Test and Heel to [employer] normal.     Comments: Gait deferred initially   Psychiatric:        Mood and Affect: Mood is anxious.     ED Results / Procedures / Treatments   Labs (all labs ordered are listed,  but only abnormal results are displayed) Labs Reviewed  CBC WITH DIFFERENTIAL/PLATELET - Abnormal; Notable for the following components:      Result Value   Monocytes Absolute 1.5 (*)    All other components within normal limits  COMPREHENSIVE METABOLIC PANEL - Abnormal; Notable for the following components:   CO2 19 (*)    Glucose, Bld 127 (*)    Total Bilirubin 1.4 (*)    Anion gap 16 (*)    All other components within normal limits  BRAIN NATRIURETIC PEPTIDE - Abnormal; Notable for the following components:   B Natriuretic Peptide 135.1 (*)    All other components within normal limits  LACTIC ACID, PLASMA  MAGNESIUM  TSH  PROTIME-INR  TROPONIN I (HIGH SENSITIVITY)  TROPONIN I (HIGH SENSITIVITY)    EKG EKG Interpretation  Date/Time:  Wednesday July 31 2020 08:28:12 EDT Ventricular Rate:  86 PR Interval:  149 QRS Duration: 97 QT Interval:  381 QTC Calculation: 456 R Axis:   -40 Text Interpretation: Sinus rhythm Left axis deviation RSR' in V1 or V2, probably normal variant Minimal ST depression, lateral leads When compared to prior, similar appearance to prior. No sTEMI Confirmed by Theda Belfast ([id number]) on 07/31/2020 8:57:55 AM   Radiology CT Angio Head W or Wo Contrast  Result Date:  07/31/2020 CLINICAL DATA:  Vertebral artery dissection suspected. Dizziness. Left leg weakness EXAM: CT ANGIOGRAPHY HEAD AND NECK TECHNIQUE: Multidetector CT imaging of the head and neck was performed using the standard protocol during bolus administration of intravenous contrast. Multiplanar CT image reconstructions and MIPs were obtained to evaluate the vascular anatomy. Carotid stenosis measurements (when applicable) are obtained utilizing NASCET criteria, using the distal internal carotid diameter as the denominator. CONTRAST:  75mL OMNIPAQUE IOHEXOL 350 MG/ML SOLN COMPARISON:  June 24, 2019. FINDINGS: CT HEAD FINDINGS Brain: No evidence of acute infarction, hemorrhage, hydrocephalus, extra-axial collection or mass lesion/mass effect. Vascular: No hyperdense vessel. Skull: No acute fracture. Sinuses: Visualized sinuses are clear.  Unremarkable orbits. Orbits: No mastoid effusions. Review of the MIP images confirms the above findings CTA NECK FINDINGS Aortic arch: Great vessel origins are patent. Right carotid system: No evidence of dissection, stenosis (50% or greater) or occlusion. Left carotid system: No evidence of dissection, stenosis (50% or greater) or occlusion. Minimal calcified atherosclerosis at the ICA origin. Vertebral arteries: Codominant. No evidence of dissection, stenosis (50% or greater) or occlusion. Skeleton: Mild multilevel cervical degenerative change. Other neck: No mass or adenopathy. Upper chest: Visualized lung apices are clear. Review of the MIP images confirms the above findings CTA HEAD FINDINGS Anterior circulation: Intracranial internal carotid arteries, anterior and middle cerebral arteries are patent. No significant change from prior. Posterior circulation: Intracranial vertebral arteries, basilar artery, and posterior cerebral arteries are patent. Similar left posterior communicating artery. No significant change from prior. Venous sinuses: As permitted by contrast timing,  patent. Review of the MIP images confirms the above findings IMPRESSION: 1. No evidence of acute intracranial abnormality. 2. No hemodynamically significant proximal stenosis or evidence of dissection in the neck. 3. No large vessel occlusion or significant proximal stenosis intracranially. 4. No significant change from the prior. Electronically Signed   By: Feliberto Harts MD   On: 07/31/2020 12:30   DG Chest 2 View  Result Date: 07/31/2020 CLINICAL DATA:  Chest pain and shortness of breath. EXAM: CHEST - 2 VIEW COMPARISON:  07/07/2005 FINDINGS: Both lungs are clear. Negative for a pneumothorax. Heart and mediastinum are within normal limits. Trachea is midline.  Bone structures are unremarkable. IMPRESSION: No active cardiopulmonary disease. Electronically Signed   By: Richarda Overlie M.D.   On: 07/31/2020 10:12   CT Angio Neck W and/or Wo Contrast  Result Date: 07/31/2020 CLINICAL DATA:  Vertebral artery dissection suspected. Dizziness. Left leg weakness EXAM: CT ANGIOGRAPHY HEAD AND NECK TECHNIQUE: Multidetector CT imaging of the head and neck was performed using the standard protocol during bolus administration of intravenous contrast. Multiplanar CT image reconstructions and MIPs were obtained to evaluate the vascular anatomy. Carotid stenosis measurements (when applicable) are obtained utilizing NASCET criteria, using the distal internal carotid diameter as the denominator. CONTRAST:  75mL OMNIPAQUE IOHEXOL 350 MG/ML SOLN COMPARISON:  June 24, 2019. FINDINGS: CT HEAD FINDINGS Brain: No evidence of acute infarction, hemorrhage, hydrocephalus, extra-axial collection or mass lesion/mass effect. Vascular: No hyperdense vessel. Skull: No acute fracture. Sinuses: Visualized sinuses are clear.  Unremarkable orbits. Orbits: No mastoid effusions. Review of the MIP images confirms the above findings CTA NECK FINDINGS Aortic arch: Great vessel origins are patent. Right carotid system: No evidence of dissection,  stenosis (50% or greater) or occlusion. Left carotid system: No evidence of dissection, stenosis (50% or greater) or occlusion. Minimal calcified atherosclerosis at the ICA origin. Vertebral arteries: Codominant. No evidence of dissection, stenosis (50% or greater) or occlusion. Skeleton: Mild multilevel cervical degenerative change. Other neck: No mass or adenopathy. Upper chest: Visualized lung apices are clear. Review of the MIP images confirms the above findings CTA HEAD FINDINGS Anterior circulation: Intracranial internal carotid arteries, anterior and middle cerebral arteries are patent. No significant change from prior. Posterior circulation: Intracranial vertebral arteries, basilar artery, and posterior cerebral arteries are patent. Similar left posterior communicating artery. No significant change from prior. Venous sinuses: As permitted by contrast timing, patent. Review of the MIP images confirms the above findings IMPRESSION: 1. No evidence of acute intracranial abnormality. 2. No hemodynamically significant proximal stenosis or evidence of dissection in the neck. 3. No large vessel occlusion or significant proximal stenosis intracranially. 4. No significant change from the prior. Electronically Signed   By: Feliberto Harts MD   On: 07/31/2020 12:30   MR Brain W and Wo Contrast  Result Date: 07/31/2020 CLINICAL DATA:  Neuro deficit, acute stroke suspected. EXAM: MRI CERVICAL AND THORACIC SPINE WITHOUT AND WITH CONTRAST TECHNIQUE: Multiplanar, multiecho pulse sequences of the brain and surrounding structures were obtained without and with intravenous contrast. Multiplanar and multiecho pulse sequences of the cervical spine, to include the craniocervical junction and cervicothoracic junction, and the thoracic spine, were obtained without and with intravenous contrast. CONTRAST:  10mL GADAVIST GADOBUTROL 1 MMOL/ML IV SOLN COMPARISON:  Same day CTA. FINDINGS: MRI HEAD FINDINGS Brain: No acute  infarction, hemorrhage, hydrocephalus, or extra-axial collection. No substantial white matter disease. No substantial change in the size/appearance of a T1 hyperintense mass in the left olfactory groove, measuring approximately 1 cm (series 26, image 20; series 27, image 29) evaluation for enhancement this lesion is limited given intrinsic T1 hyperintensity. Otherwise, no abnormal enhancement. Vascular: Major arterial flow voids are maintained at the skull base. Further evaluated on same day CTA. Skull and upper cervical spine: Normal marrow signal. Sinuses/Orbits: Mild ethmoid air cell mucosal thickening without air-fluid levels. Unremarkable orbits. Other: No sizable mastoid effusions. MRI CERVICAL SPINE FINDINGS Alignment: No substantial sagittal subluxation. Straightening of the normal cervical lordosis. Vertebrae: Vertebral body heights are maintained. No specific evidence of acute fracture, discitis/osteomyelitis, or suspicious bone lesion. Cord: Normal cord signal.  No abnormal cord enhancement. Posterior  Fossa, vertebral arteries, paraspinal tissues: Unremarkable. Disc levels: C2-C3: No significant disc protrusion, foraminal stenosis, or canal stenosis. C3-C4: Small left eccentric posterior disc osteophyte complex and left greater than right facet/uncovertebral hypertrophy. Resulting moderate left and mild right foraminal stenosis and mild narrowing of the left root entry zone without significant central canal stenosis. No significant right foraminal stenosis. C4-C5: Small posterior disc osteophyte complex and left greater than right facet and uncovertebral hypertrophy. Resulting moderate left and mild right foraminal stenosis. No significant canal stenosis. C5-C6: Left greater than right facet and uncovertebral hypertrophy with mild-to-moderate left foraminal stenosis. No significant canal or right foraminal stenosis. C6-C7: Left greater than right uncovertebral hypertrophy with mild-to-moderate left  foraminal stenosis. No significant canal or right foraminal stenosis. C7-T1: No significant disc protrusion, foraminal stenosis, or canal stenosis. MRI THORACIC SPINE FINDINGS Alignment:  Normal Vertebrae: Degenerative endplate Schmorl's nodes at multiple levels. Vertebral body heights are maintained. No specific evidence of acute fracture, discitis/osteomyelitis, or suspicious bone lesion. Cord:  Normal cord signal and morphology.  No abnormal enhancement. Paraspinal and other soft tissues: Unremarkable. Disc levels: No significant disc protrusion, foraminal stenosis, or canal stenosis. IMPRESSION: MRI head: 1. No acute intracranial abnormality. Specifically, no acute infarct. 2. No change in a 1 cm mass in the left olfactory groove, with differential considerations described on the prior MRI from February 08, 2020. MRI cervical spine: 1. Multilevel left foraminal stenosis, moderate at C3-C4 and C4-C5 and mild-to-moderate at C5-C6, and C6-C7. Mild right foraminal stenosis C3-C4 and C4-C5. 2. No significant canal stenosis. Normal cord signal. MRI thoracic spine: No significant canal or foraminal stenosis.  Normal cord signal. Electronically Signed   By: Feliberto Harts MD   On: 07/31/2020 15:10   MR Cervical Spine W or Wo Contrast  Result Date: 07/31/2020 CLINICAL DATA:  Neuro deficit, acute stroke suspected. EXAM: MRI CERVICAL AND THORACIC SPINE WITHOUT AND WITH CONTRAST TECHNIQUE: Multiplanar, multiecho pulse sequences of the brain and surrounding structures were obtained without and with intravenous contrast. Multiplanar and multiecho pulse sequences of the cervical spine, to include the craniocervical junction and cervicothoracic junction, and the thoracic spine, were obtained without and with intravenous contrast. CONTRAST:  8mL GADAVIST GADOBUTROL 1 MMOL/ML IV SOLN COMPARISON:  Same day CTA. FINDINGS: MRI HEAD FINDINGS Brain: No acute infarction, hemorrhage, hydrocephalus, or extra-axial collection. No  substantial white matter disease. No substantial change in the size/appearance of a T1 hyperintense mass in the left olfactory groove, measuring approximately 1 cm (series 26, image 20; series 27, image 29) evaluation for enhancement this lesion is limited given intrinsic T1 hyperintensity. Otherwise, no abnormal enhancement. Vascular: Major arterial flow voids are maintained at the skull base. Further evaluated on same day CTA. Skull and upper cervical spine: Normal marrow signal. Sinuses/Orbits: Mild ethmoid air cell mucosal thickening without air-fluid levels. Unremarkable orbits. Other: No sizable mastoid effusions. MRI CERVICAL SPINE FINDINGS Alignment: No substantial sagittal subluxation. Straightening of the normal cervical lordosis. Vertebrae: Vertebral body heights are maintained. No specific evidence of acute fracture, discitis/osteomyelitis, or suspicious bone lesion. Cord: Normal cord signal.  No abnormal cord enhancement. Posterior Fossa, vertebral arteries, paraspinal tissues: Unremarkable. Disc levels: C2-C3: No significant disc protrusion, foraminal stenosis, or canal stenosis. C3-C4: Small left eccentric posterior disc osteophyte complex and left greater than right facet/uncovertebral hypertrophy. Resulting moderate left and mild right foraminal stenosis and mild narrowing of the left root entry zone without significant central canal stenosis. No significant right foraminal stenosis. C4-C5: Small posterior disc osteophyte complex and left  greater than right facet and uncovertebral hypertrophy. Resulting moderate left and mild right foraminal stenosis. No significant canal stenosis. C5-C6: Left greater than right facet and uncovertebral hypertrophy with mild-to-moderate left foraminal stenosis. No significant canal or right foraminal stenosis. C6-C7: Left greater than right uncovertebral hypertrophy with mild-to-moderate left foraminal stenosis. No significant canal or right foraminal stenosis.  C7-T1: No significant disc protrusion, foraminal stenosis, or canal stenosis. MRI THORACIC SPINE FINDINGS Alignment:  Normal Vertebrae: Degenerative endplate Schmorl's nodes at multiple levels. Vertebral body heights are maintained. No specific evidence of acute fracture, discitis/osteomyelitis, or suspicious bone lesion. Cord:  Normal cord signal and morphology.  No abnormal enhancement. Paraspinal and other soft tissues: Unremarkable. Disc levels: No significant disc protrusion, foraminal stenosis, or canal stenosis. IMPRESSION: MRI head: 1. No acute intracranial abnormality. Specifically, no acute infarct. 2. No change in a 1 cm mass in the left olfactory groove, with differential considerations described on the prior MRI from February 08, 2020. MRI cervical spine: 1. Multilevel left foraminal stenosis, moderate at C3-C4 and C4-C5 and mild-to-moderate at C5-C6, and C6-C7. Mild right foraminal stenosis C3-C4 and C4-C5. 2. No significant canal stenosis. Normal cord signal. MRI thoracic spine: No significant canal or foraminal stenosis.  Normal cord signal. Electronically Signed   By: Feliberto Harts MD   On: 07/31/2020 15:10   MR THORACIC SPINE W WO CONTRAST  Result Date: 07/31/2020 CLINICAL DATA:  Neuro deficit, acute stroke suspected. EXAM: MRI CERVICAL AND THORACIC SPINE WITHOUT AND WITH CONTRAST TECHNIQUE: Multiplanar, multiecho pulse sequences of the brain and surrounding structures were obtained without and with intravenous contrast. Multiplanar and multiecho pulse sequences of the cervical spine, to include the craniocervical junction and cervicothoracic junction, and the thoracic spine, were obtained without and with intravenous contrast. CONTRAST:  10mL GADAVIST GADOBUTROL 1 MMOL/ML IV SOLN COMPARISON:  Same day CTA. FINDINGS: MRI HEAD FINDINGS Brain: No acute infarction, hemorrhage, hydrocephalus, or extra-axial collection. No substantial white matter disease. No substantial change in the  size/appearance of a T1 hyperintense mass in the left olfactory groove, measuring approximately 1 cm (series 26, image 20; series 27, image 29) evaluation for enhancement this lesion is limited given intrinsic T1 hyperintensity. Otherwise, no abnormal enhancement. Vascular: Major arterial flow voids are maintained at the skull base. Further evaluated on same day CTA. Skull and upper cervical spine: Normal marrow signal. Sinuses/Orbits: Mild ethmoid air cell mucosal thickening without air-fluid levels. Unremarkable orbits. Other: No sizable mastoid effusions. MRI CERVICAL SPINE FINDINGS Alignment: No substantial sagittal subluxation. Straightening of the normal cervical lordosis. Vertebrae: Vertebral body heights are maintained. No specific evidence of acute fracture, discitis/osteomyelitis, or suspicious bone lesion. Cord: Normal cord signal.  No abnormal cord enhancement. Posterior Fossa, vertebral arteries, paraspinal tissues: Unremarkable. Disc levels: C2-C3: No significant disc protrusion, foraminal stenosis, or canal stenosis. C3-C4: Small left eccentric posterior disc osteophyte complex and left greater than right facet/uncovertebral hypertrophy. Resulting moderate left and mild right foraminal stenosis and mild narrowing of the left root entry zone without significant central canal stenosis. No significant right foraminal stenosis. C4-C5: Small posterior disc osteophyte complex and left greater than right facet and uncovertebral hypertrophy. Resulting moderate left and mild right foraminal stenosis. No significant canal stenosis. C5-C6: Left greater than right facet and uncovertebral hypertrophy with mild-to-moderate left foraminal stenosis. No significant canal or right foraminal stenosis. C6-C7: Left greater than right uncovertebral hypertrophy with mild-to-moderate left foraminal stenosis. No significant canal or right foraminal stenosis. C7-T1: No significant disc protrusion, foraminal stenosis, or canal  stenosis. MRI THORACIC SPINE FINDINGS Alignment:  Normal Vertebrae: Degenerative endplate Schmorl's nodes at multiple levels. Vertebral body heights are maintained. No specific evidence of acute fracture, discitis/osteomyelitis, or suspicious bone lesion. Cord:  Normal cord signal and morphology.  No abnormal enhancement. Paraspinal and other soft tissues: Unremarkable. Disc levels: No significant disc protrusion, foraminal stenosis, or canal stenosis. IMPRESSION: MRI head: 1. No acute intracranial abnormality. Specifically, no acute infarct. 2. No change in a 1 cm mass in the left olfactory groove, with differential considerations described on the prior MRI from February 08, 2020. MRI cervical spine: 1. Multilevel left foraminal stenosis, moderate at C3-C4 and C4-C5 and mild-to-moderate at C5-C6, and C6-C7. Mild right foraminal stenosis C3-C4 and C4-C5. 2. No significant canal stenosis. Normal cord signal. MRI thoracic spine: No significant canal or foraminal stenosis.  Normal cord signal. Electronically Signed   By: Feliberto Harts MD   On: 07/31/2020 15:10    Procedures Procedures   Medications Ordered in ED Medications  LORazepam (ATIVAN) injection 1 mg (1 mg Intravenous Given 07/31/20 1250)  iohexol (OMNIPAQUE) 350 MG/ML injection 75 mL (75 mLs Intravenous Contrast Given 07/31/20 1144)  gadobutrol (GADAVIST) 1 MMOL/ML injection 10 mL (10 mLs Intravenous Contrast Given 07/31/20 1436)    ED Course  I have reviewed the triage vital signs and the nursing notes.  Pertinent labs & imaging results that were available during my care of the patient were reviewed by me and considered in my medical decision making (see chart for details).    MDM Rules/Calculators/A&P                          GRIFF BADLEY is a 60 y.o. male with minimal past medical history aside from migraines but with recent right uveitis currently on CellCept and prednisone and several months of left sided numbness in left arm and  left leg who presents with acute worsening of neck pain, headache, upper back pain, chest pressure, exertional shortness of breath worsening, and new lightheaded, dizziness, and left leg weakness and coordination problem.  He reports that all the symptoms of been ongoing for the last 3 or 4 days since the weekend.  He reports that he has been on CellCept and steroids for a while after he was diagnosed with uveitis.  He reports that during his tapering process his uveitis has flared back up when decreasing his dose of prednisone however he reports that last week he went from 20 a day to 15 mg a day and has not seem to had an issue.  He says that over the weekend, he started having chest pain and pressure in his central chest that is still feeling now.  He reports it does not radiate necessarily.  He does report he has had exertional shortness of breath that has worsened.  He reports that he has had pain in his right neck going into his head that is new and is feeling new lightheadedness and dizziness.  He also feels palpitations and is feeling anxious.  He reports his blood pressure has been up and down  since being on the steroids and has been in the 150s to 160s over the last week or 2.  He reports that he has had some numbness in his left arm and left leg for which she had nerve conduction studies at [facility] and he reports that they told him there was likely a problem in the spine but he has not  had any imaging done reportedly.  He reports that her left several days he has had difficulty walking with his left leg weakness and feeling like it is not acting coordinated.  He reports the numbness has been persistent in the left arm and left leg.  He denies any facial symptoms with no facial droop difficulty speaking, or new vision changes.  Denies any facial numbness or weakness.  Denies other complaints on arrival.  On exam, lungs are clear and chest is nontender, cannot reproduce his discomfort.  Back is  nontender.  Neck is nontender.  No carotid bruit appreciated.  Patient had no numbness or weakness of the face with no facial droop.  Pupils are symmetric with normal extraocular movements.  Pupils are symmetric and reactive with no significant redness seen on right eye exam as he had in the past reportedly.  Patient had no numbness in the arms good pulses.  Patient had numbness in the left leg compared to the right which she reports is been persistent.  He did not have weakness on my exam of the legs.  Intact pulses.  He also has some redness in his right tricep area which she reports he got poison ivy on last week.  EKG shows no STEMI.  Clinically I am concerned about a multitude of etiologies of his symptoms.  Given the new chest pain, will get cardiac work-up including chest x-ray, troponins, labs, and monitor on telemetry.  We will get electrolytes and magnesium as well as a BNP for the exertional shortness of breath.  We will also get a TSH to look for thyroid because of his palpitations and jitteriness.  I am also concerned about the new weakness in the left leg and the dizziness reported in the context of neck and headache.  I spoke with neurology who recommended several images.  They recommended CTA of the head and neck to rule out dissection or bleeding abnormality with his blood pressure being elevated.  They also recommended MRI with and without contrast of the brain, C-spine, and T-spine given the arm and leg symptoms and the dizziness symptoms.  Given his recent inflammatory phenomena around his body with a uveitis, they felt that was important to rule out MS or other demyelinating inflammatory problem in his back or head.  Anticipate reassessment after work-up to determine disposition.  3:33 PM Aside from a delta troponin, work-up has returned.  We went through all of the findings including the CT, MRI, initial troponin, and labs.  Clinically I do suspect a component of reflux, potential mild  vertigo, and anxiety playing most of the role with his symptoms.  We discussed that he needs to follow-up with PCP, neurology as an outpatient, cardiology, and likely GI as well.  Patient was asked if he could double his dose of Protonix which was felt to be reasonable.  Care transferred to oncoming team awaiting for results of delta troponin.  If it is negative and not significant elevated, dissipate discharge and patient agrees at this time.   Final Clinical Impression(s) / ED Diagnoses Final diagnoses:  Nonintractable headache, unspecified chronicity pattern, unspecified headache type  Numbness on left side  Dizziness  Chest pain, unspecified type    . Clinical Impression: 1. Nonintractable headache, unspecified chronicity pattern, unspecified headache type   2. Numbness on left side   3. Dizziness   4. Chest pain, unspecified type     Disposition: Care transferred to oncoming team awaiting for results of delta troponin.  If it is negative and not significant elevated, dissipate discharge and patient agrees at this time.  This note was prepared with assistance of Conservation officer, historic buildingsDragon voice recognition software. Occasional wrong-word or sound-a-like substitutions may have occurred due to the inherent limitations of voice recognition software.      Awesome Jared, Canary Brimhristopher J, MD 07/31/20 [id number]

## 2020-07-31 NOTE — ED Triage Notes (Signed)
Pt reports cp,sob, dizziness. Pt report he started feeling Sick x3 days ago. Pt denies any slurred speech. Pt reports left leg weakness. Neuro intact.

## 2020-07-31 NOTE — ED Notes (Signed)
Patient transported to MRI 

## 2020-07-31 NOTE — ED Provider Notes (Signed)
I received the patient in signout from Dr. Rush Landmark, briefly the patient is a 60 year old male that had dizziness and nausea and a feeling like his left leg was weak.  This occurred this morning.  Patient had an MRI and a CT angiogram of the head and neck that were negative.  He is feeling much better now.  He was having some chest pressure with this and some shortness of breath.  Occurred while he was up and moving about work.  Plan for 2 troponins if negative will discharge home.  Patient second troponin is negative though is twice the original value.  I discussed the ambiguity of this with the patient and family.  I offered multiple options including obtaining a third troponin versus coming into the hospital.  Patient is he is feeling better would prefer to go home.  Will call his family doctor to try and establish an appointment with a cardiologist.  Agrees to return at any time his symptoms worsen.   Melene Plan, DO 07/31/20 1616

## 2020-07-31 NOTE — ED Notes (Signed)
Back from imaging.

## 2020-08-05 ENCOUNTER — Other Ambulatory Visit: Payer: Self-pay | Admitting: Family Medicine

## 2020-08-05 DIAGNOSIS — R200 Anesthesia of skin: Secondary | ICD-10-CM | POA: Diagnosis not present

## 2020-08-05 DIAGNOSIS — R0600 Dyspnea, unspecified: Secondary | ICD-10-CM | POA: Diagnosis not present

## 2020-08-08 DIAGNOSIS — H3581 Retinal edema: Secondary | ICD-10-CM | POA: Diagnosis not present

## 2020-08-08 DIAGNOSIS — H15003 Unspecified scleritis, bilateral: Secondary | ICD-10-CM | POA: Diagnosis not present

## 2020-08-08 DIAGNOSIS — Z79899 Other long term (current) drug therapy: Secondary | ICD-10-CM | POA: Diagnosis not present

## 2020-08-14 DIAGNOSIS — H15003 Unspecified scleritis, bilateral: Secondary | ICD-10-CM | POA: Diagnosis not present

## 2020-08-14 DIAGNOSIS — R760 Raised antibody titer: Secondary | ICD-10-CM | POA: Diagnosis not present

## 2020-08-14 DIAGNOSIS — Z79899 Other long term (current) drug therapy: Secondary | ICD-10-CM | POA: Diagnosis not present

## 2020-08-14 DIAGNOSIS — R202 Paresthesia of skin: Secondary | ICD-10-CM | POA: Diagnosis not present

## 2020-08-16 ENCOUNTER — Ambulatory Visit
Admission: RE | Admit: 2020-08-16 | Discharge: 2020-08-16 | Disposition: A | Discharge status: Home / Self Care | Payer: BC Managed Care – PPO | Source: Ambulatory Visit | Attending: Family Medicine | Admitting: Family Medicine

## 2020-08-16 ENCOUNTER — Other Ambulatory Visit: Payer: Self-pay

## 2020-08-16 DIAGNOSIS — M545 Low back pain, unspecified: Secondary | ICD-10-CM | POA: Diagnosis not present

## 2020-08-16 DIAGNOSIS — R200 Anesthesia of skin: Secondary | ICD-10-CM

## 2020-08-16 IMAGING — MR MR LUMBAR SPINE W/O CM
4 of 5 series · 27 of 48 positions shown · non-contrast
Comparison: None.

CLINICAL DATA: Left leg numbness; technologist note states low back
pain radiating to both legs

EXAM:
MRI LUMBAR SPINE WITHOUT CONTRAST
TECHNIQUE: Multiplanar, multisequence MR imaging of the lumbar spine was
performed. No intravenous contrast was administered.

[Series 3: T2 · sagittal · 4.0mm · 1.21mm/px · 5 of 19 slices shown (1 of 2)]
[im 1/19]
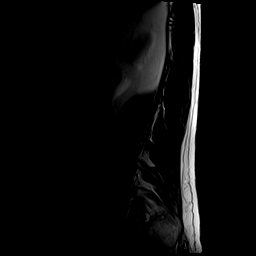
[im 5/19]
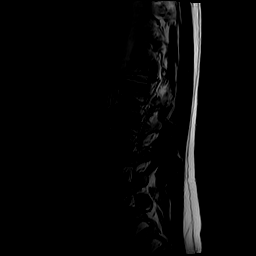
[im 10/19]
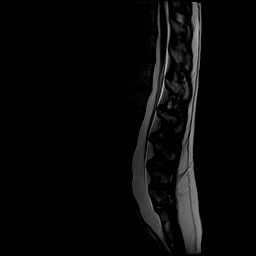
[im 14/19]
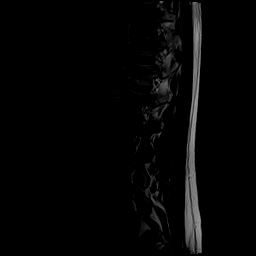
[im 19/19]
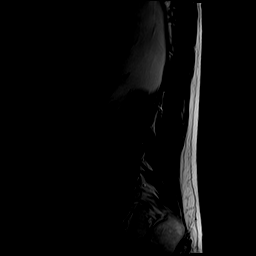

[Series 5: T1 · sagittal · 4.0mm · 1.21mm/px · 6 of 19 slices shown (1 of 2)]
[im 1/19]
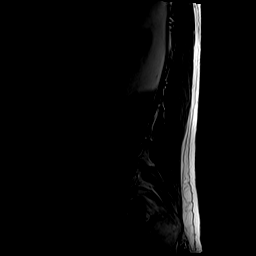
[im 4/19]
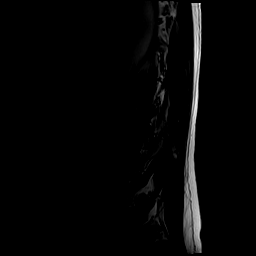
[im 8/19]
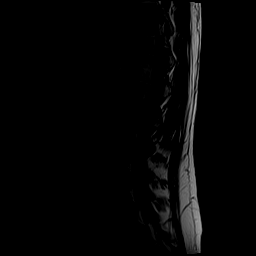
[im 11/19]
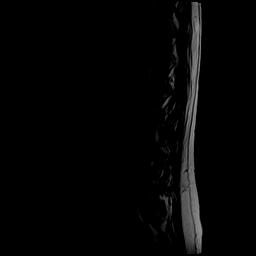
[im 15/19]
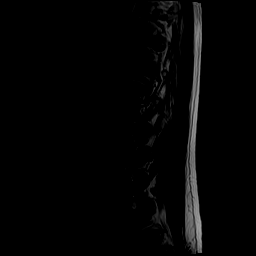
[im 19/19]
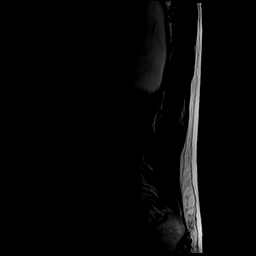

[Series 6: T2 · axial · 4.0mm · 0.39mm/px · z∈[-89,+153]mm · 10 of 52 slices shown (2 of 2)]
[im 4/52]
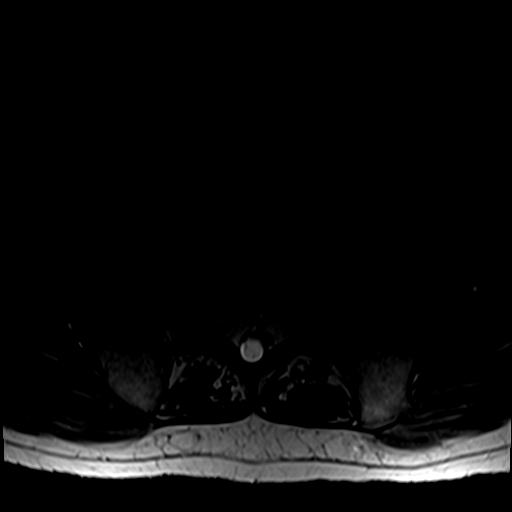
[im 7/52]
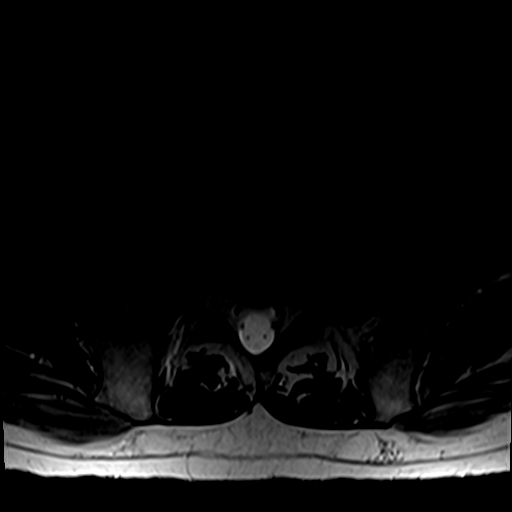
[im 11/52]
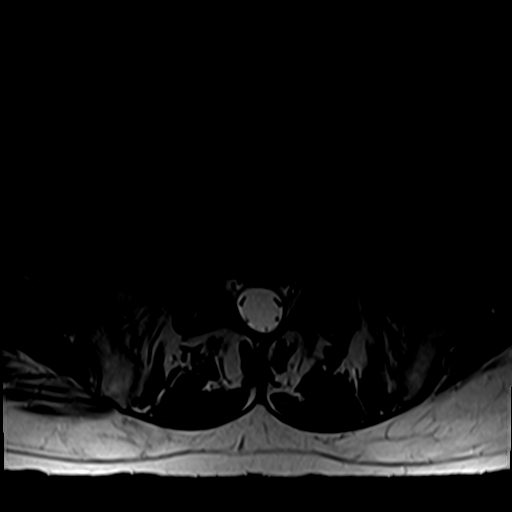
[im 18/52]
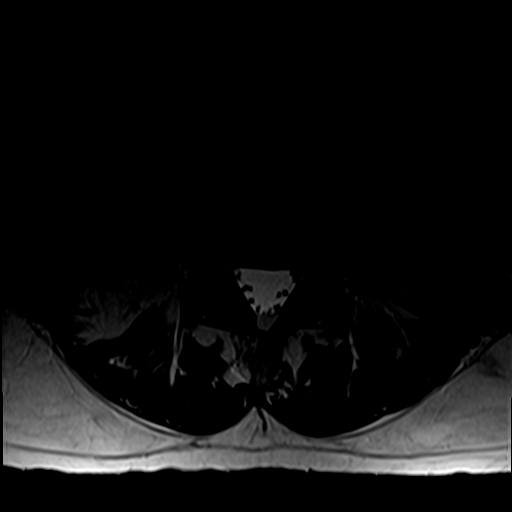
[im 24/52]
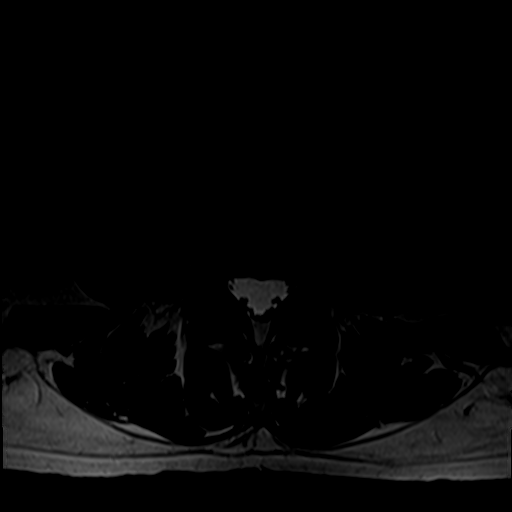
[im 28/52]
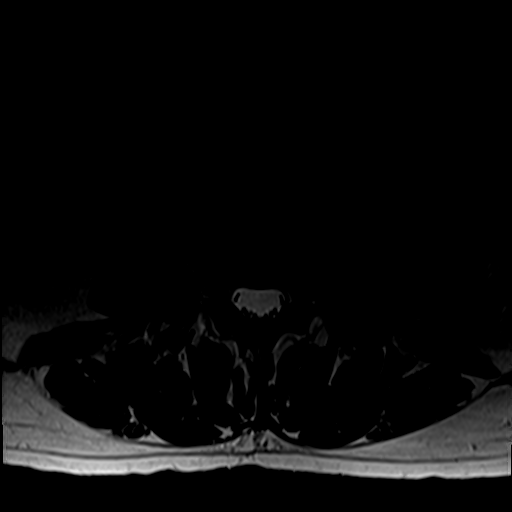
[im 31/52]
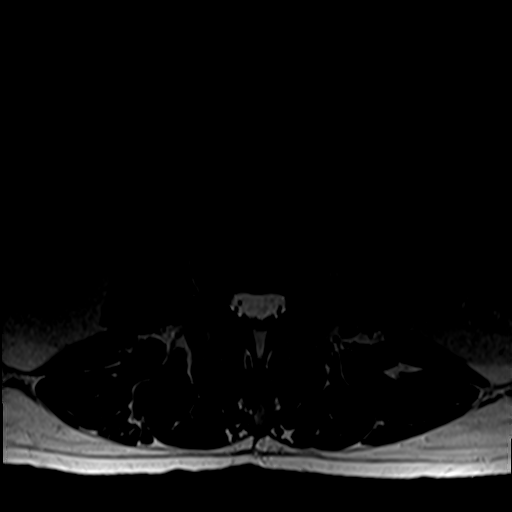
[im 38/52]
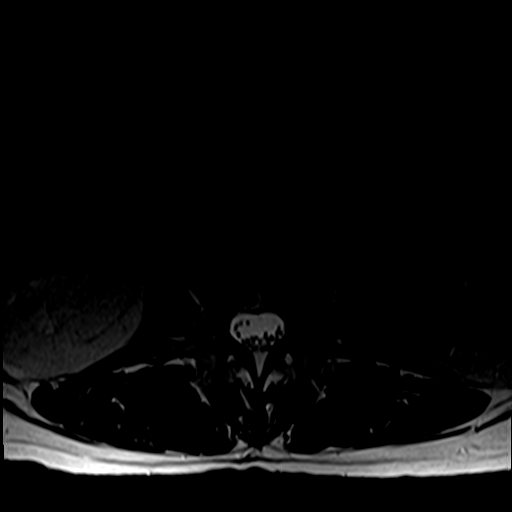
[im 45/52]
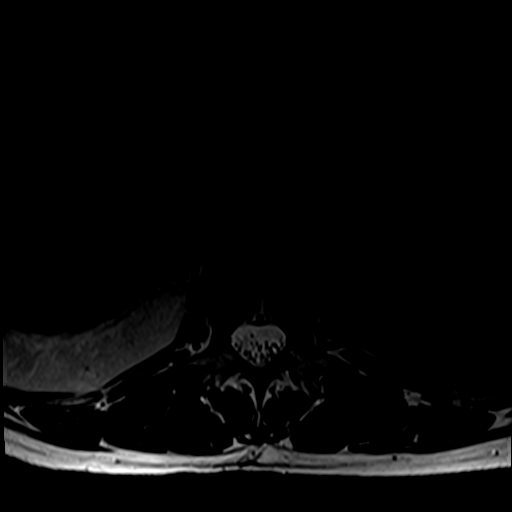
[im 52/52]
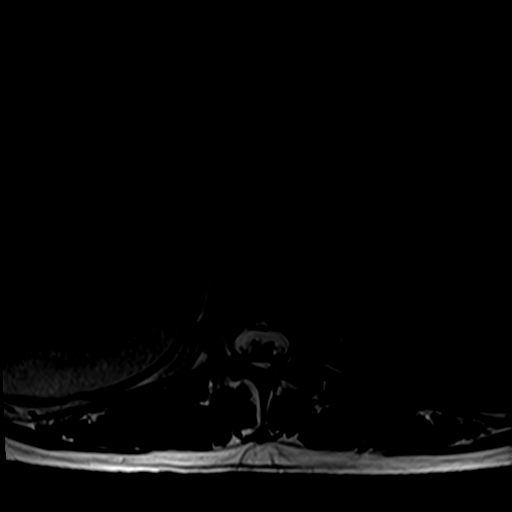

[Series 7: T1 · axial · 4.0mm · 0.39mm/px · z∈[-89,+119]mm · 6 of 52 slices shown (2 of 2)]
[im 4/52]
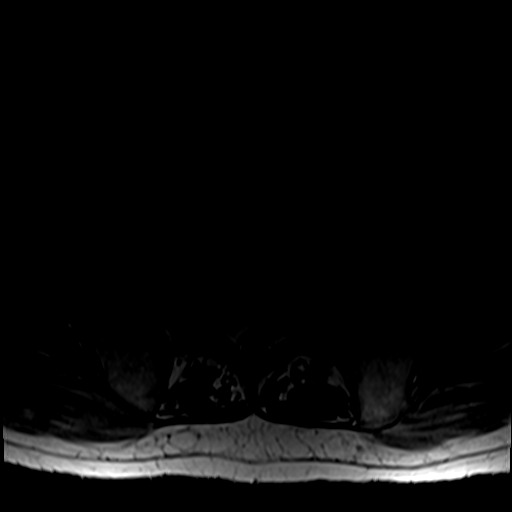
[im 7/52]
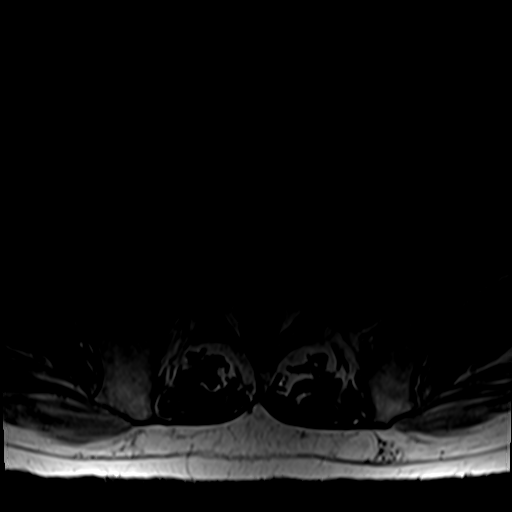
[im 11/52]
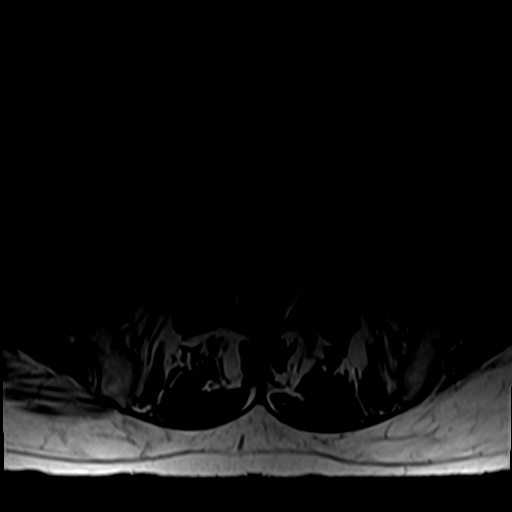
[im 18/52]
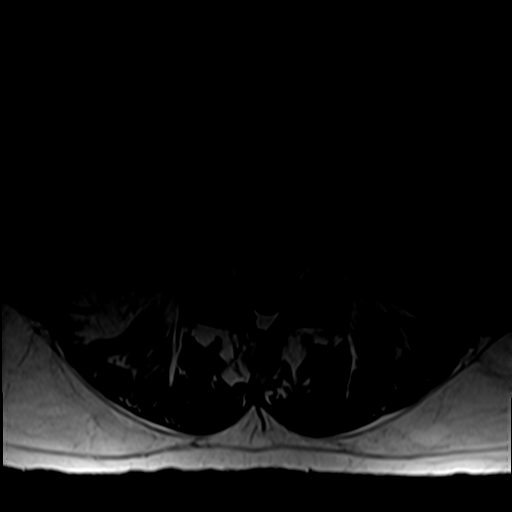
[im 28/52]
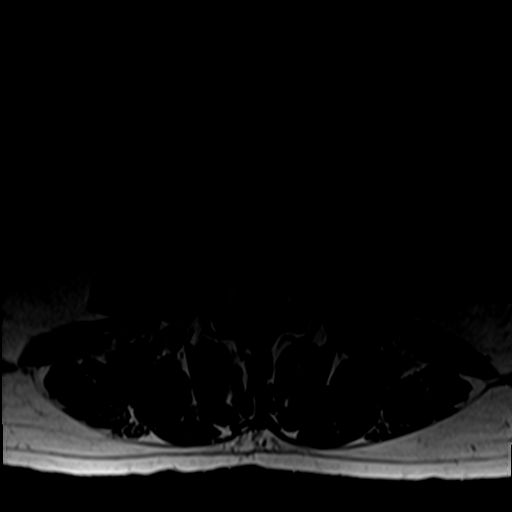
[im 45/52]
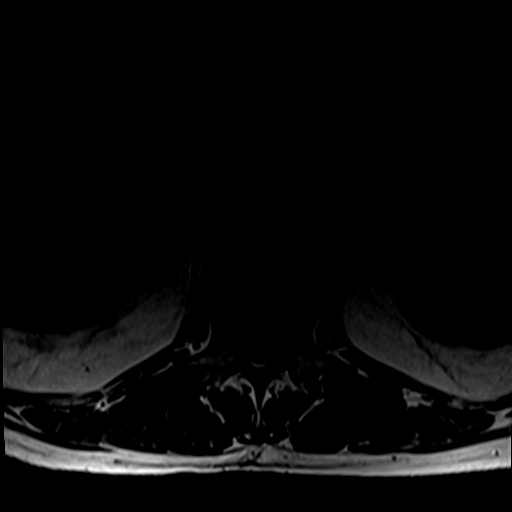

[27 of 48 positions shown; findings below may reference images not displayed]

FINDINGS: Segmentation:  Standard.

Alignment:  No significant listhesis.

Vertebrae: Vertebral body heights are maintained. Minor degenerate
endplate irregularity. No substantial marrow edema. No suspicious
osseous lesion.

Conus medullaris and cauda equina: Conus extends to the T12-L1
level. Conus and cauda equina appear normal.

Paraspinal and other soft tissues: Unremarkable.

Disc levels:

L1-L2:  No canal or foraminal stenosis.

L2-L3:  No canal or foraminal stenosis.

L3-L4:  No canal or foraminal stenosis.

L4-L5:  No canal or foraminal stenosis.

L5-S1: Shallow central protrusion. No evidence of traversing nerve
root displacement. No canal or foraminal stenosis.
IMPRESSION: No significant degenerative stenosis or evidence of nerve root
compression.

## 2020-09-03 DIAGNOSIS — D229 Melanocytic nevi, unspecified: Secondary | ICD-10-CM | POA: Diagnosis not present

## 2020-09-03 DIAGNOSIS — L821 Other seborrheic keratosis: Secondary | ICD-10-CM | POA: Diagnosis not present

## 2020-09-03 DIAGNOSIS — L814 Other melanin hyperpigmentation: Secondary | ICD-10-CM | POA: Diagnosis not present

## 2020-09-03 DIAGNOSIS — D849 Immunodeficiency, unspecified: Secondary | ICD-10-CM | POA: Diagnosis not present

## 2020-09-03 DIAGNOSIS — L570 Actinic keratosis: Secondary | ICD-10-CM | POA: Diagnosis not present

## 2020-09-05 DIAGNOSIS — H15003 Unspecified scleritis, bilateral: Secondary | ICD-10-CM | POA: Diagnosis not present

## 2020-09-05 DIAGNOSIS — R760 Raised antibody titer: Secondary | ICD-10-CM | POA: Diagnosis not present

## 2020-09-05 DIAGNOSIS — R202 Paresthesia of skin: Secondary | ICD-10-CM | POA: Diagnosis not present

## 2020-09-18 DIAGNOSIS — R7309 Other abnormal glucose: Secondary | ICD-10-CM | POA: Diagnosis not present

## 2020-09-19 DIAGNOSIS — H15003 Unspecified scleritis, bilateral: Secondary | ICD-10-CM | POA: Diagnosis not present

## 2020-09-19 DIAGNOSIS — R760 Raised antibody titer: Secondary | ICD-10-CM | POA: Diagnosis not present

## 2020-09-19 DIAGNOSIS — R202 Paresthesia of skin: Secondary | ICD-10-CM | POA: Diagnosis not present

## 2020-10-04 DIAGNOSIS — H15003 Unspecified scleritis, bilateral: Secondary | ICD-10-CM | POA: Diagnosis not present

## 2020-10-04 DIAGNOSIS — H33101 Unspecified retinoschisis, right eye: Secondary | ICD-10-CM | POA: Insufficient documentation

## 2020-10-04 DIAGNOSIS — Z79899 Other long term (current) drug therapy: Secondary | ICD-10-CM | POA: Diagnosis not present

## 2020-10-04 DIAGNOSIS — H3581 Retinal edema: Secondary | ICD-10-CM | POA: Insufficient documentation

## 2020-10-04 DIAGNOSIS — H35371 Puckering of macula, right eye: Secondary | ICD-10-CM | POA: Diagnosis not present

## 2020-10-18 DIAGNOSIS — R7309 Other abnormal glucose: Secondary | ICD-10-CM | POA: Diagnosis not present

## 2020-11-04 DIAGNOSIS — Z79899 Other long term (current) drug therapy: Secondary | ICD-10-CM | POA: Diagnosis not present

## 2020-11-04 DIAGNOSIS — H15003 Unspecified scleritis, bilateral: Secondary | ICD-10-CM | POA: Diagnosis not present

## 2020-11-08 DIAGNOSIS — H15003 Unspecified scleritis, bilateral: Secondary | ICD-10-CM | POA: Diagnosis not present

## 2020-11-08 DIAGNOSIS — Z79899 Other long term (current) drug therapy: Secondary | ICD-10-CM | POA: Diagnosis not present

## 2020-11-18 DIAGNOSIS — R7309 Other abnormal glucose: Secondary | ICD-10-CM | POA: Diagnosis not present

## 2020-12-19 DIAGNOSIS — R7309 Other abnormal glucose: Secondary | ICD-10-CM | POA: Diagnosis not present

## 2020-12-26 DIAGNOSIS — M545 Low back pain, unspecified: Secondary | ICD-10-CM | POA: Insufficient documentation

## 2020-12-26 DIAGNOSIS — M25552 Pain in left hip: Secondary | ICD-10-CM | POA: Diagnosis not present

## 2020-12-30 DIAGNOSIS — M5416 Radiculopathy, lumbar region: Secondary | ICD-10-CM | POA: Diagnosis not present

## 2020-12-30 DIAGNOSIS — R202 Paresthesia of skin: Secondary | ICD-10-CM | POA: Diagnosis not present

## 2020-12-30 DIAGNOSIS — M545 Low back pain, unspecified: Secondary | ICD-10-CM | POA: Diagnosis not present

## 2020-12-30 DIAGNOSIS — R760 Raised antibody titer: Secondary | ICD-10-CM | POA: Diagnosis not present

## 2020-12-30 DIAGNOSIS — H15003 Unspecified scleritis, bilateral: Secondary | ICD-10-CM | POA: Diagnosis not present

## 2021-01-07 DIAGNOSIS — M545 Low back pain, unspecified: Secondary | ICD-10-CM | POA: Diagnosis not present

## 2021-01-07 DIAGNOSIS — M5416 Radiculopathy, lumbar region: Secondary | ICD-10-CM | POA: Diagnosis not present

## 2021-01-09 DIAGNOSIS — M545 Low back pain, unspecified: Secondary | ICD-10-CM | POA: Diagnosis not present

## 2021-01-09 DIAGNOSIS — M5416 Radiculopathy, lumbar region: Secondary | ICD-10-CM | POA: Diagnosis not present

## 2021-01-14 DIAGNOSIS — M5416 Radiculopathy, lumbar region: Secondary | ICD-10-CM | POA: Diagnosis not present

## 2021-01-14 DIAGNOSIS — M545 Low back pain, unspecified: Secondary | ICD-10-CM | POA: Diagnosis not present

## 2021-01-16 DIAGNOSIS — M5432 Sciatica, left side: Secondary | ICD-10-CM | POA: Diagnosis not present

## 2021-01-16 DIAGNOSIS — G5702 Lesion of sciatic nerve, left lower limb: Secondary | ICD-10-CM | POA: Insufficient documentation

## 2021-01-16 DIAGNOSIS — M5416 Radiculopathy, lumbar region: Secondary | ICD-10-CM | POA: Insufficient documentation

## 2021-01-18 DIAGNOSIS — R7309 Other abnormal glucose: Secondary | ICD-10-CM | POA: Diagnosis not present

## 2021-02-10 DIAGNOSIS — R21 Rash and other nonspecific skin eruption: Secondary | ICD-10-CM | POA: Diagnosis not present

## 2021-02-10 DIAGNOSIS — Z85828 Personal history of other malignant neoplasm of skin: Secondary | ICD-10-CM | POA: Diagnosis not present

## 2021-02-10 DIAGNOSIS — L570 Actinic keratosis: Secondary | ICD-10-CM | POA: Diagnosis not present

## 2021-02-11 DIAGNOSIS — R760 Raised antibody titer: Secondary | ICD-10-CM | POA: Diagnosis not present

## 2021-02-11 DIAGNOSIS — H15003 Unspecified scleritis, bilateral: Secondary | ICD-10-CM | POA: Diagnosis not present

## 2021-02-11 DIAGNOSIS — R202 Paresthesia of skin: Secondary | ICD-10-CM | POA: Diagnosis not present

## 2021-02-18 DIAGNOSIS — Z79899 Other long term (current) drug therapy: Secondary | ICD-10-CM | POA: Diagnosis not present

## 2021-02-18 DIAGNOSIS — H15003 Unspecified scleritis, bilateral: Secondary | ICD-10-CM | POA: Diagnosis not present

## 2021-02-18 DIAGNOSIS — R7309 Other abnormal glucose: Secondary | ICD-10-CM | POA: Diagnosis not present

## 2021-02-18 DIAGNOSIS — H33101 Unspecified retinoschisis, right eye: Secondary | ICD-10-CM | POA: Diagnosis not present

## 2021-02-20 DIAGNOSIS — M5412 Radiculopathy, cervical region: Secondary | ICD-10-CM | POA: Insufficient documentation

## 2021-02-20 DIAGNOSIS — M542 Cervicalgia: Secondary | ICD-10-CM | POA: Insufficient documentation

## 2021-02-25 DIAGNOSIS — M542 Cervicalgia: Secondary | ICD-10-CM | POA: Diagnosis not present

## 2021-02-25 DIAGNOSIS — M5412 Radiculopathy, cervical region: Secondary | ICD-10-CM | POA: Diagnosis not present

## 2021-02-27 DIAGNOSIS — M542 Cervicalgia: Secondary | ICD-10-CM | POA: Diagnosis not present

## 2021-02-27 DIAGNOSIS — M5412 Radiculopathy, cervical region: Secondary | ICD-10-CM | POA: Diagnosis not present

## 2021-03-03 DIAGNOSIS — Z0000 Encounter for general adult medical examination without abnormal findings: Secondary | ICD-10-CM | POA: Diagnosis not present

## 2021-03-03 DIAGNOSIS — K219 Gastro-esophageal reflux disease without esophagitis: Secondary | ICD-10-CM | POA: Diagnosis not present

## 2021-03-03 DIAGNOSIS — M542 Cervicalgia: Secondary | ICD-10-CM | POA: Diagnosis not present

## 2021-03-03 DIAGNOSIS — M5412 Radiculopathy, cervical region: Secondary | ICD-10-CM | POA: Diagnosis not present

## 2021-03-03 DIAGNOSIS — Z125 Encounter for screening for malignant neoplasm of prostate: Secondary | ICD-10-CM | POA: Diagnosis not present

## 2021-03-04 DIAGNOSIS — H33021 Retinal detachment with multiple breaks, right eye: Secondary | ICD-10-CM | POA: Insufficient documentation

## 2021-03-04 DIAGNOSIS — H15003 Unspecified scleritis, bilateral: Secondary | ICD-10-CM | POA: Diagnosis not present

## 2021-03-04 DIAGNOSIS — Z79899 Other long term (current) drug therapy: Secondary | ICD-10-CM | POA: Diagnosis not present

## 2021-03-04 DIAGNOSIS — H33101 Unspecified retinoschisis, right eye: Secondary | ICD-10-CM | POA: Diagnosis not present

## 2021-03-05 DIAGNOSIS — H35411 Lattice degeneration of retina, right eye: Secondary | ICD-10-CM | POA: Diagnosis not present

## 2021-03-05 DIAGNOSIS — I10 Essential (primary) hypertension: Secondary | ICD-10-CM | POA: Diagnosis not present

## 2021-03-05 DIAGNOSIS — K219 Gastro-esophageal reflux disease without esophagitis: Secondary | ICD-10-CM | POA: Diagnosis not present

## 2021-03-05 DIAGNOSIS — H33021 Retinal detachment with multiple breaks, right eye: Secondary | ICD-10-CM | POA: Diagnosis not present

## 2021-03-20 DIAGNOSIS — I10 Essential (primary) hypertension: Secondary | ICD-10-CM | POA: Diagnosis not present

## 2021-04-08 DIAGNOSIS — H33021 Retinal detachment with multiple breaks, right eye: Secondary | ICD-10-CM | POA: Diagnosis not present

## 2021-04-08 DIAGNOSIS — H15003 Unspecified scleritis, bilateral: Secondary | ICD-10-CM | POA: Diagnosis not present

## 2021-04-08 DIAGNOSIS — R760 Raised antibody titer: Secondary | ICD-10-CM | POA: Diagnosis not present

## 2021-04-08 DIAGNOSIS — R202 Paresthesia of skin: Secondary | ICD-10-CM | POA: Diagnosis not present

## 2021-04-20 DIAGNOSIS — I10 Essential (primary) hypertension: Secondary | ICD-10-CM | POA: Diagnosis not present

## 2021-04-29 ENCOUNTER — Encounter (INDEPENDENT_AMBULATORY_CARE_PROVIDER_SITE_OTHER): Payer: BC Managed Care – PPO | Admitting: Ophthalmology

## 2021-05-14 DIAGNOSIS — H15003 Unspecified scleritis, bilateral: Secondary | ICD-10-CM | POA: Diagnosis not present

## 2021-05-14 DIAGNOSIS — R760 Raised antibody titer: Secondary | ICD-10-CM | POA: Diagnosis not present

## 2021-05-14 DIAGNOSIS — R202 Paresthesia of skin: Secondary | ICD-10-CM | POA: Diagnosis not present

## 2021-05-16 DIAGNOSIS — Z7969 Long term (current) use of other immunomodulators and immunosuppressants: Secondary | ICD-10-CM | POA: Diagnosis not present

## 2021-05-16 DIAGNOSIS — H15003 Unspecified scleritis, bilateral: Secondary | ICD-10-CM | POA: Diagnosis not present

## 2021-05-16 DIAGNOSIS — M5416 Radiculopathy, lumbar region: Secondary | ICD-10-CM | POA: Diagnosis not present

## 2021-05-16 DIAGNOSIS — J029 Acute pharyngitis, unspecified: Secondary | ICD-10-CM | POA: Diagnosis not present

## 2021-05-16 DIAGNOSIS — Z79899 Other long term (current) drug therapy: Secondary | ICD-10-CM | POA: Diagnosis not present

## 2021-05-18 DIAGNOSIS — J029 Acute pharyngitis, unspecified: Secondary | ICD-10-CM | POA: Diagnosis not present

## 2021-05-20 DIAGNOSIS — H15003 Unspecified scleritis, bilateral: Secondary | ICD-10-CM | POA: Diagnosis not present

## 2021-05-21 DIAGNOSIS — I10 Essential (primary) hypertension: Secondary | ICD-10-CM | POA: Diagnosis not present

## 2021-05-22 DIAGNOSIS — H15003 Unspecified scleritis, bilateral: Secondary | ICD-10-CM | POA: Diagnosis not present

## 2021-05-22 DIAGNOSIS — F419 Anxiety disorder, unspecified: Secondary | ICD-10-CM | POA: Diagnosis not present

## 2021-05-22 DIAGNOSIS — E785 Hyperlipidemia, unspecified: Secondary | ICD-10-CM | POA: Diagnosis not present

## 2021-05-22 DIAGNOSIS — R3129 Other microscopic hematuria: Secondary | ICD-10-CM | POA: Diagnosis not present

## 2021-05-22 DIAGNOSIS — R100 Acute abdomen: Secondary | ICD-10-CM | POA: Diagnosis not present

## 2021-05-22 DIAGNOSIS — R109 Unspecified abdominal pain: Secondary | ICD-10-CM | POA: Diagnosis not present

## 2021-05-22 DIAGNOSIS — G43109 Migraine with aura, not intractable, without status migrainosus: Secondary | ICD-10-CM | POA: Diagnosis not present

## 2021-06-06 DIAGNOSIS — H15003 Unspecified scleritis, bilateral: Secondary | ICD-10-CM | POA: Diagnosis not present

## 2021-06-06 DIAGNOSIS — H33021 Retinal detachment with multiple breaks, right eye: Secondary | ICD-10-CM | POA: Diagnosis not present

## 2021-06-06 DIAGNOSIS — Z79899 Other long term (current) drug therapy: Secondary | ICD-10-CM | POA: Diagnosis not present

## 2021-06-06 DIAGNOSIS — H33101 Unspecified retinoschisis, right eye: Secondary | ICD-10-CM | POA: Diagnosis not present

## 2021-06-20 DIAGNOSIS — I10 Essential (primary) hypertension: Secondary | ICD-10-CM | POA: Diagnosis not present

## 2021-07-16 DIAGNOSIS — H15003 Unspecified scleritis, bilateral: Secondary | ICD-10-CM | POA: Diagnosis not present

## 2021-07-16 DIAGNOSIS — R760 Raised antibody titer: Secondary | ICD-10-CM | POA: Diagnosis not present

## 2021-07-16 DIAGNOSIS — R202 Paresthesia of skin: Secondary | ICD-10-CM | POA: Diagnosis not present

## 2021-07-20 DIAGNOSIS — I10 Essential (primary) hypertension: Secondary | ICD-10-CM | POA: Diagnosis not present

## 2021-08-19 DIAGNOSIS — I10 Essential (primary) hypertension: Secondary | ICD-10-CM | POA: Diagnosis not present

## 2021-08-20 DIAGNOSIS — H25811 Combined forms of age-related cataract, right eye: Secondary | ICD-10-CM | POA: Diagnosis not present

## 2021-08-20 DIAGNOSIS — H2512 Age-related nuclear cataract, left eye: Secondary | ICD-10-CM | POA: Diagnosis not present

## 2021-08-20 DIAGNOSIS — H33021 Retinal detachment with multiple breaks, right eye: Secondary | ICD-10-CM | POA: Diagnosis not present

## 2021-08-20 DIAGNOSIS — H15003 Unspecified scleritis, bilateral: Secondary | ICD-10-CM | POA: Diagnosis not present

## 2021-08-22 DIAGNOSIS — H25041 Posterior subcapsular polar age-related cataract, right eye: Secondary | ICD-10-CM | POA: Insufficient documentation

## 2021-09-18 DIAGNOSIS — I10 Essential (primary) hypertension: Secondary | ICD-10-CM | POA: Diagnosis not present

## 2021-09-30 DIAGNOSIS — Z79899 Other long term (current) drug therapy: Secondary | ICD-10-CM | POA: Diagnosis not present

## 2021-09-30 DIAGNOSIS — H33101 Unspecified retinoschisis, right eye: Secondary | ICD-10-CM | POA: Diagnosis not present

## 2021-09-30 DIAGNOSIS — H33021 Retinal detachment with multiple breaks, right eye: Secondary | ICD-10-CM | POA: Diagnosis not present

## 2021-09-30 DIAGNOSIS — H15003 Unspecified scleritis, bilateral: Secondary | ICD-10-CM | POA: Diagnosis not present

## 2021-10-02 DIAGNOSIS — Z79899 Other long term (current) drug therapy: Secondary | ICD-10-CM | POA: Diagnosis not present

## 2021-10-02 DIAGNOSIS — H15003 Unspecified scleritis, bilateral: Secondary | ICD-10-CM | POA: Diagnosis not present

## 2021-10-13 DIAGNOSIS — H25041 Posterior subcapsular polar age-related cataract, right eye: Secondary | ICD-10-CM | POA: Diagnosis not present

## 2021-10-13 DIAGNOSIS — Z881 Allergy status to other antibiotic agents status: Secondary | ICD-10-CM | POA: Diagnosis not present

## 2021-10-13 DIAGNOSIS — Z888 Allergy status to other drugs, medicaments and biological substances status: Secondary | ICD-10-CM | POA: Diagnosis not present

## 2021-10-18 DIAGNOSIS — I10 Essential (primary) hypertension: Secondary | ICD-10-CM | POA: Diagnosis not present

## 2021-10-23 DIAGNOSIS — H52201 Unspecified astigmatism, right eye: Secondary | ICD-10-CM | POA: Diagnosis not present

## 2021-10-23 DIAGNOSIS — H25811 Combined forms of age-related cataract, right eye: Secondary | ICD-10-CM | POA: Diagnosis not present

## 2021-11-11 DIAGNOSIS — Z85828 Personal history of other malignant neoplasm of skin: Secondary | ICD-10-CM | POA: Diagnosis not present

## 2021-11-11 DIAGNOSIS — L821 Other seborrheic keratosis: Secondary | ICD-10-CM | POA: Diagnosis not present

## 2021-11-11 DIAGNOSIS — H33021 Retinal detachment with multiple breaks, right eye: Secondary | ICD-10-CM | POA: Diagnosis not present

## 2021-11-11 DIAGNOSIS — L300 Nummular dermatitis: Secondary | ICD-10-CM | POA: Diagnosis not present

## 2021-11-11 DIAGNOSIS — L814 Other melanin hyperpigmentation: Secondary | ICD-10-CM | POA: Diagnosis not present

## 2021-11-11 DIAGNOSIS — Z79899 Other long term (current) drug therapy: Secondary | ICD-10-CM | POA: Diagnosis not present

## 2021-11-11 DIAGNOSIS — H33101 Unspecified retinoschisis, right eye: Secondary | ICD-10-CM | POA: Diagnosis not present

## 2021-11-11 DIAGNOSIS — H15003 Unspecified scleritis, bilateral: Secondary | ICD-10-CM | POA: Diagnosis not present

## 2021-11-11 DIAGNOSIS — L570 Actinic keratosis: Secondary | ICD-10-CM | POA: Diagnosis not present

## 2021-11-17 DIAGNOSIS — Z79624 Long term (current) use of inhibitors of nucleotide synthesis: Secondary | ICD-10-CM | POA: Diagnosis not present

## 2021-11-17 DIAGNOSIS — R42 Dizziness and giddiness: Secondary | ICD-10-CM | POA: Diagnosis not present

## 2021-11-17 DIAGNOSIS — H9319 Tinnitus, unspecified ear: Secondary | ICD-10-CM | POA: Diagnosis not present

## 2021-11-17 DIAGNOSIS — Z7952 Long term (current) use of systemic steroids: Secondary | ICD-10-CM | POA: Diagnosis not present

## 2021-11-17 DIAGNOSIS — Z5181 Encounter for therapeutic drug level monitoring: Secondary | ICD-10-CM | POA: Diagnosis not present

## 2021-11-17 DIAGNOSIS — M5416 Radiculopathy, lumbar region: Secondary | ICD-10-CM | POA: Diagnosis not present

## 2021-11-17 DIAGNOSIS — Z79899 Other long term (current) drug therapy: Secondary | ICD-10-CM | POA: Diagnosis not present

## 2021-11-17 DIAGNOSIS — H15003 Unspecified scleritis, bilateral: Secondary | ICD-10-CM | POA: Diagnosis not present

## 2021-11-18 DIAGNOSIS — R5383 Other fatigue: Secondary | ICD-10-CM | POA: Diagnosis not present

## 2021-11-18 DIAGNOSIS — I10 Essential (primary) hypertension: Secondary | ICD-10-CM | POA: Diagnosis not present

## 2021-12-16 DIAGNOSIS — H15003 Unspecified scleritis, bilateral: Secondary | ICD-10-CM | POA: Diagnosis not present

## 2021-12-16 DIAGNOSIS — H9313 Tinnitus, bilateral: Secondary | ICD-10-CM | POA: Diagnosis not present

## 2021-12-16 DIAGNOSIS — R42 Dizziness and giddiness: Secondary | ICD-10-CM | POA: Diagnosis not present

## 2021-12-19 DIAGNOSIS — I10 Essential (primary) hypertension: Secondary | ICD-10-CM | POA: Diagnosis not present

## 2022-01-16 DIAGNOSIS — H33101 Unspecified retinoschisis, right eye: Secondary | ICD-10-CM | POA: Diagnosis not present

## 2022-01-16 DIAGNOSIS — H15003 Unspecified scleritis, bilateral: Secondary | ICD-10-CM | POA: Diagnosis not present

## 2022-01-16 DIAGNOSIS — H33021 Retinal detachment with multiple breaks, right eye: Secondary | ICD-10-CM | POA: Diagnosis not present

## 2022-01-16 DIAGNOSIS — Z79899 Other long term (current) drug therapy: Secondary | ICD-10-CM | POA: Diagnosis not present

## 2022-01-18 DIAGNOSIS — I10 Essential (primary) hypertension: Secondary | ICD-10-CM | POA: Diagnosis not present

## 2022-02-17 DIAGNOSIS — Z79899 Other long term (current) drug therapy: Secondary | ICD-10-CM | POA: Diagnosis not present

## 2022-02-17 DIAGNOSIS — H15003 Unspecified scleritis, bilateral: Secondary | ICD-10-CM | POA: Diagnosis not present

## 2022-02-18 DIAGNOSIS — Z961 Presence of intraocular lens: Secondary | ICD-10-CM | POA: Diagnosis not present

## 2022-02-18 DIAGNOSIS — I10 Essential (primary) hypertension: Secondary | ICD-10-CM | POA: Diagnosis not present

## 2022-02-18 DIAGNOSIS — Z9841 Cataract extraction status, right eye: Secondary | ICD-10-CM | POA: Diagnosis not present

## 2022-02-18 DIAGNOSIS — H26491 Other secondary cataract, right eye: Secondary | ICD-10-CM | POA: Diagnosis not present

## 2022-02-19 ENCOUNTER — Telehealth: Payer: Self-pay | Admitting: *Deleted

## 2022-02-19 NOTE — Chronic Care Management (AMB) (Signed)
  Care Coordination   Note   02/19/2022 Name: Darren Wilson MRN: [id number] DOB: April 23, 1960  Darren Wilson is a 61 y.o. year old male who sees [place], L.Marlou Sa, MD for primary care. I reached out to Delray Alt by phone today to offer care coordination services.  [name] was given information about Care Coordination services today including:   The Care Coordination services include support from the care team which includes your Nurse Coordinator, Clinical Social Worker, or Pharmacist.  The Care Coordination team is here to help remove barriers to the health concerns and goals most important to you. Care Coordination services are voluntary, and the patient may decline or stop services at any time by request to their care team member.   Care Coordination Consent Status: Patient did not agree to participate in care coordination services at this time.    Encounter Outcome:  Pt. Refused due to changing PCP office to [facility].   [place]  Direct Dial: [phone]

## 2022-02-19 NOTE — Chronic Care Management (AMB) (Signed)
  Care Coordination  Outreach Note  02/19/2022 Name: Darren Wilson MRN: [id number] DOB: 12/01/1960   Care Coordination Outreach Attempts: An unsuccessful telephone outreach was attempted today to offer the patient information about available care coordination services as a benefit of their health plan.   Follow Up Plan:  Additional outreach attempts will be made to offer the patient care coordination information and services.   Encounter Outcome:  No Answer  [place]  Direct Dial: [phone]

## 2022-03-05 DIAGNOSIS — H15003 Unspecified scleritis, bilateral: Secondary | ICD-10-CM | POA: Diagnosis not present

## 2022-03-05 DIAGNOSIS — D649 Anemia, unspecified: Secondary | ICD-10-CM | POA: Diagnosis not present

## 2022-03-05 DIAGNOSIS — Z125 Encounter for screening for malignant neoplasm of prostate: Secondary | ICD-10-CM | POA: Diagnosis not present

## 2022-03-05 DIAGNOSIS — F419 Anxiety disorder, unspecified: Secondary | ICD-10-CM | POA: Diagnosis not present

## 2022-03-05 DIAGNOSIS — Z0000 Encounter for general adult medical examination without abnormal findings: Secondary | ICD-10-CM | POA: Diagnosis not present

## 2022-03-05 DIAGNOSIS — G43109 Migraine with aura, not intractable, without status migrainosus: Secondary | ICD-10-CM | POA: Diagnosis not present

## 2022-03-05 DIAGNOSIS — E785 Hyperlipidemia, unspecified: Secondary | ICD-10-CM | POA: Diagnosis not present

## 2022-03-20 DIAGNOSIS — I10 Essential (primary) hypertension: Secondary | ICD-10-CM | POA: Diagnosis not present

## 2022-05-10 DIAGNOSIS — H2512 Age-related nuclear cataract, left eye: Secondary | ICD-10-CM | POA: Insufficient documentation

## 2023-05-27 ENCOUNTER — Encounter (HOSPITAL_BASED_OUTPATIENT_CLINIC_OR_DEPARTMENT_OTHER): Payer: Self-pay | Admitting: Emergency Medicine

## 2023-05-27 ENCOUNTER — Other Ambulatory Visit: Payer: Self-pay

## 2023-05-27 ENCOUNTER — Emergency Department (HOSPITAL_BASED_OUTPATIENT_CLINIC_OR_DEPARTMENT_OTHER)
Admission: EM | Admit: 2023-05-27 | Discharge: 2023-05-27 | Discharge status: Against Medical Advice | Payer: BC Managed Care – PPO | Attending: Emergency Medicine | Admitting: Emergency Medicine

## 2023-05-27 ENCOUNTER — Emergency Department (HOSPITAL_BASED_OUTPATIENT_CLINIC_OR_DEPARTMENT_OTHER): Payer: BC Managed Care – PPO

## 2023-05-27 DIAGNOSIS — M545 Low back pain, unspecified: Secondary | ICD-10-CM | POA: Insufficient documentation

## 2023-05-27 DIAGNOSIS — Z5321 Procedure and treatment not carried out due to patient leaving prior to being seen by health care provider: Secondary | ICD-10-CM | POA: Diagnosis not present

## 2023-05-27 DIAGNOSIS — R1030 Lower abdominal pain, unspecified: Secondary | ICD-10-CM | POA: Insufficient documentation

## 2023-05-27 DIAGNOSIS — R110 Nausea: Secondary | ICD-10-CM | POA: Diagnosis not present

## 2023-05-27 LAB — CBC WITH DIFFERENTIAL/PLATELET
Abs Immature Granulocytes: 0.01 10*3/uL (ref 0.00–0.07)
Basophils Absolute: 0 10*3/uL (ref 0.0–0.1)
Basophils Relative: 1 %
Eosinophils Absolute: 0.1 10*3/uL (ref 0.0–0.5)
Eosinophils Relative: 2 %
HCT: 41.9 % (ref 39.0–52.0)
Hemoglobin: 13.9 g/dL (ref 13.0–17.0)
Immature Granulocytes: 0 %
Lymphocytes Relative: 29 %
Lymphs Abs: 1.5 10*3/uL (ref 0.7–4.0)
MCH: 32.6 pg (ref 26.0–34.0)
MCHC: 33.2 g/dL (ref 30.0–36.0)
MCV: 98.1 fL (ref 80.0–100.0)
Monocytes Absolute: 0.7 10*3/uL (ref 0.1–1.0)
Monocytes Relative: 13 %
Neutro Abs: 2.9 10*3/uL (ref 1.7–7.7)
Neutrophils Relative %: 55 %
Platelets: 187 10*3/uL (ref 150–400)
RBC: 4.27 MIL/uL (ref 4.22–5.81)
RDW: 12.6 % (ref 11.5–15.5)
WBC: 5.2 10*3/uL (ref 4.0–10.5)
nRBC: 0 % (ref 0.0–0.2)

## 2023-05-27 LAB — BASIC METABOLIC PANEL
Anion gap: 7 (ref 5–15)
BUN: 14 mg/dL (ref 6–20)
CO2: 24 mmol/L (ref 22–32)
Calcium: 9.8 mg/dL (ref 8.9–10.3)
Chloride: 106 mmol/L (ref 98–111)
Creatinine, Ser: 0.98 mg/dL (ref 0.61–1.24)
GFR, Estimated: >60 mL/min (ref >60)
Glucose, Bld: 129 mg/dL — ABNORMAL HIGH (ref 70–99)
Potassium: 4 mmol/L (ref 3.5–5.1)
Sodium: 137 mmol/L (ref 135–145)

## 2023-05-27 NOTE — ED Notes (Signed)
Pt called x 3 to be roomed with no answer. 

## 2023-05-27 NOTE — ED Triage Notes (Addendum)
 Groin pain x 2 day, no into right testicle Some lower back pain and nausea Burning on urination  UA neg - done at [facility]

## 2023-07-12 ENCOUNTER — Encounter: Payer: Self-pay | Admitting: Neurology

## 2023-07-12 ENCOUNTER — Ambulatory Visit (INDEPENDENT_AMBULATORY_CARE_PROVIDER_SITE_OTHER): Payer: BC Managed Care – PPO | Admitting: Neurology

## 2023-07-12 VITALS — BP 158/83 | HR 83 | Ht 74.0 in | Wt 223.0 lb

## 2023-07-12 DIAGNOSIS — R202 Paresthesia of skin: Secondary | ICD-10-CM | POA: Diagnosis not present

## 2023-07-12 NOTE — Progress Notes (Signed)
 Chief Complaint  Patient presents with   New Patient (Initial Visit)    Rm15, alone, NP referral for chronic left upper left index finger and middle only, and lower left extremity numbness/Dr. Rodolph Bong [facility] [phone]:pt stated this started back 3-4 years ago, mentioned occasional pins and needles (more so in the evening prior to falling asleep), [facility] (2022)      ASSESSMENT AND PLAN  Darren Wilson is a 63 y.o. 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, but it was present at previous MRI from 2021, we will have repeat MRI of the brain by his primary care physician  MRI of the cervical spine showed multilevel degenerative changes, but there was no evidence of the cord or nerve root compression, was essentially normal MRI of lumbar and thoracic spine,  Also had a EMG nerve conduction study at [facility] in March 2022, I do not have the report  With his normal examination, relative stable clinical course, I do not think repeat studies needed, may continue observe his symptoms, differentiation diagnosis include focal neuropathy such as carpal tunnel syndromes, lumbar radiculopathy,  DIAGNOSTIC DATA (LABS, IMAGING, TESTING) - I reviewed patient records, labs, notes, testing and imaging myself where available.   MEDICAL HISTORY:  Darren Wilson, 57-year-old male seen in request by sports medicine physician Dr. Penni Bombard, [place] for evaluation of left hand, and foot numbness, his primary care is from,[facility] Dr. Vivi Ferns, Jovita Gamma, initial evaluation was on July 12, 2023 History is obtained from the patient and review of electronic medical records. I personally reviewed pertinent available imaging films in PACS.   PMHx of  GERD Finasteride to prevent hair loss Chronic migraine,   He has intermittent  numbness of left index and third fingers since 2022, around the same time he noticed the neck pain tends to stay on the left side, some radiating pain along the left arm, also low back pain, left side, left foot numbness, he received physical therapy, which helped the pain, but continue to have numbness  Over the past couple years, he denies pain, denies limitation from his sensory complaints, walks 5 miles of daily without any difficulties, but did notice that seems to have bigger area of numbness now, from left to toes to the ball of left foot, mainly involving left index and third finger  He had extensive evaluation in the past, personally reviewed MRI of the brain with without contrast from April 2022, 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  PHYSICAL EXAMNIATION:  Gen: NAD, conversant, well nourised, well groomed                     Cardiovascular: Regular rate rhythm, no peripheral edema, warm, nontender. Eyes: Conjunctivae clear without exudates or hemorrhage Neck: Supple, no carotid bruits. Pulmonary: Clear to auscultation bilaterally   NEUROLOGICAL EXAM:  MENTAL STATUS: Speech/cognition: Awake,  alert, oriented to history taking and casual conversation CRANIAL NERVES: CN II: Visual fields are full to confrontation. Pupils are round equal and briskly reactive to light. CN III, IV, VI: extraocular movement are normal. No ptosis. CN V: Facial sensation is intact to light touch CN VII: Face is symmetric with normal eye closure  CN VIII: Hearing is normal to causal conversation. CN IX, X: Phonation is normal. CN  XI: Head turning and shoulder shrug are intact  MOTOR: There is no pronator drift of out-stretched arms. Muscle bulk and tone are normal. Muscle strength is normal.  REFLEXES: Reflexes are 2+ and symmetric at the biceps, triceps, knees, and ankles. Plantar responses are flexor.  SENSORY: Intact to light touch, pinprick and vibratory sensation are intact in fingers and toes.  COORDINATION: There is no trunk or limb dysmetria noted.  GAIT/STANCE: Posture is normal. Gait is steady with normal steps, base, arm swing, and turning. Heel and toe walking are normal. Tandem gait is normal.  Romberg is absent.  REVIEW OF SYSTEMS:  Full 14 system review of systems performed and notable only for as above All other review of systems were negative.   ALLERGIES: Allergies  Allergen Reactions   Tetracyclines & Related Other (See Comments)    GI upset    HOME MEDICATIONS: Current Outpatient Medications  Medication Sig Dispense Refill   clonazePAM (KLONOPIN) 0.5 MG tablet Take 0.5 mg by mouth daily as needed.     finasteride (PROSCAR) 5 MG tablet Take 2.5 mg by mouth daily.      loratadine (CLARITIN) 10 MG tablet Take 10 mg by mouth daily as needed.      OVER THE COUNTER MEDICATION Place 1 drop into both eyes daily as needed (for dryness of eyes). [employer] brand dry eye drops     pantoprazole (PROTONIX) 20 MG tablet Take 20 mg by mouth daily as needed.     propranolol (INDERAL) 20 MG tablet As directed, as needed. 90 tablet 11   SUMAtriptan (IMITREX) 100 MG tablet TAKE 1 TABLET (100 MG TOTAL) BY MOUTH EVERY 2 (TWO) HOURS AS NEEDED. (Patient taking differently: Take 100 mg by mouth every 2 (two) hours as needed.) 10 tablet 0   No current facility-administered medications for this visit.    PAST MEDICAL HISTORY: Past Medical History:  Diagnosis Date   Migraines     PAST SURGICAL HISTORY: History reviewed. No pertinent surgical history.  FAMILY HISTORY: Family History  Problem  Relation Age of Onset   Heart attack Father     SOCIAL HISTORY: Social History   Socioeconomic History   Marital status: Married    Spouse name: Jasmine December   Number of children: 2   Years of education: Not on file   Highest education level: Bachelor's degree (e.g., BA, AB, BS)  Occupational History   Not on file  Tobacco Use   Smoking status: Never    Passive exposure: Never   Smokeless tobacco: Never  Vaping Use   Vaping status: Never Used  Substance and Sexual Activity   Alcohol use: Yes    Alcohol/week: 6.0 standard drinks of alcohol    Types: 6 Standard drinks or equivalent per week   Drug use: No   Sexual activity: Yes    Birth control/protection: None  Other Topics Concern   Not on file  Social History Narrative   Lives with wife and 2 daughter 12-15   Social Drivers of Corporate investment banker Strain: Not on [employer]  Insecurity: Low Risk  (03/09/2023)   Received from [facility]   Hunger Vital Sign    Worried About Running Out of Food in the Last Year: Never true    Ran Out of Food in the Last Year: Never true  Transportation Needs: No Transportation Needs (03/09/2023)   Received from [facility]    In the past 12 months, has lack of reliable transportation kept you from medical appointments, meetings, work or from getting things needed for daily living? : No  Physical Activity: Not on file  Stress: Not on file  Social Connections: Not on file  Intimate Partner Violence: Not on file      Levert Feinstein, M.D. Ph.D.  [facility] [address] [place], [place] [address] Ph: [phone] Fax: [phone]  CC:  Delfin Gant, MD [address] [place],  [place] [address]  Lazoff, Shawn P, DO
